# Patient Record
Sex: Male | Born: 1950
Health system: Southern US, Community
[De-identification: ages and names within clinical notes are randomized; demographics above are authoritative.]

## PROBLEM LIST (undated history)

## (undated) DIAGNOSIS — G629 Polyneuropathy, unspecified: Secondary | ICD-10-CM

## (undated) DIAGNOSIS — G4730 Sleep apnea, unspecified: Secondary | ICD-10-CM

## (undated) DIAGNOSIS — E7800 Pure hypercholesterolemia, unspecified: Secondary | ICD-10-CM

## (undated) HISTORY — PX: LEG SURGERY: SHX1003

## (undated) HISTORY — PX: HIP SURGERY: SHX245

---

## 2010-10-10 ENCOUNTER — Other Ambulatory Visit (HOSPITAL_COMMUNITY): Payer: Self-pay | Admitting: Internal Medicine

## 2010-10-10 DIAGNOSIS — M79609 Pain in unspecified limb: Secondary | ICD-10-CM

## 2010-10-13 ENCOUNTER — Ambulatory Visit (HOSPITAL_COMMUNITY)
Admission: RE | Admit: 2010-10-13 | Discharge: 2010-10-13 | Disposition: A | Discharge status: Home / Self Care | Payer: BC Managed Care – PPO | Source: Ambulatory Visit | Attending: Internal Medicine | Admitting: Internal Medicine

## 2010-10-13 ENCOUNTER — Inpatient Hospital Stay (HOSPITAL_COMMUNITY)
Admission: EM | Admit: 2010-10-13 | Discharge: 2010-10-16 | DRG: 277 | Disposition: A | Discharge status: Home / Self Care | Payer: BC Managed Care – PPO | Attending: Internal Medicine | Admitting: Internal Medicine

## 2010-10-13 DIAGNOSIS — D72829 Elevated white blood cell count, unspecified: Secondary | ICD-10-CM | POA: Diagnosis present

## 2010-10-13 DIAGNOSIS — L02419 Cutaneous abscess of limb, unspecified: Principal | ICD-10-CM | POA: Diagnosis present

## 2010-10-13 DIAGNOSIS — I10 Essential (primary) hypertension: Secondary | ICD-10-CM | POA: Diagnosis present

## 2010-10-13 DIAGNOSIS — T380X5A Adverse effect of glucocorticoids and synthetic analogues, initial encounter: Secondary | ICD-10-CM | POA: Diagnosis present

## 2010-10-13 DIAGNOSIS — M79609 Pain in unspecified limb: Secondary | ICD-10-CM | POA: Insufficient documentation

## 2010-10-13 DIAGNOSIS — I8000 Phlebitis and thrombophlebitis of superficial vessels of unspecified lower extremity: Secondary | ICD-10-CM | POA: Diagnosis present

## 2010-10-13 DIAGNOSIS — E119 Type 2 diabetes mellitus without complications: Secondary | ICD-10-CM | POA: Diagnosis present

## 2010-10-13 DIAGNOSIS — L03119 Cellulitis of unspecified part of limb: Principal | ICD-10-CM | POA: Diagnosis present

## 2010-10-13 LAB — CBC
HCT: 42.6 % (ref 39.0–52.0)
Hemoglobin: 14.4 g/dL (ref 13.0–17.0)
MCH: 29.9 pg (ref 26.0–34.0)
MCHC: 33.8 g/dL (ref 30.0–36.0)
MCV: 88.4 fL (ref 78.0–100.0)
Platelets: 176 10*3/uL (ref 150–400)
RBC: 4.82 MIL/uL (ref 4.22–5.81)
RDW: 13 % (ref 11.5–15.5)
WBC: 12.1 10*3/uL — ABNORMAL HIGH (ref 4.0–10.5)

## 2010-10-13 LAB — BASIC METABOLIC PANEL
Calcium: 9.6 mg/dL (ref 8.4–10.5)
GFR calc Af Amer: >60 mL/min (ref >60)
GFR calc non Af Amer: >60 mL/min (ref >60)
Glucose, Bld: 144 mg/dL — ABNORMAL HIGH (ref 70–99)
Potassium: 4.1 mEq/L (ref 3.5–5.1)
Sodium: 138 mEq/L (ref 135–145)

## 2010-10-13 LAB — DIFFERENTIAL
Basophils Absolute: 0 10*3/uL (ref 0.0–0.1)
Basophils Relative: 0 % (ref 0–1)
Eosinophils Absolute: 0.1 10*3/uL (ref 0.0–0.7)
Eosinophils Relative: 1 % (ref 0–5)
Monocytes Absolute: 1.2 10*3/uL — ABNORMAL HIGH (ref 0.1–1.0)
Monocytes Relative: 10 % (ref 3–12)
Neutro Abs: 8.6 10*3/uL — ABNORMAL HIGH (ref 1.7–7.7)

## 2010-10-14 LAB — DIFFERENTIAL
Eosinophils Absolute: 0.1 10*3/uL (ref 0.0–0.7)
Lymphocytes Relative: 21 % (ref 12–46)
Lymphs Abs: 2 10*3/uL (ref 0.7–4.0)
Neutro Abs: 6.4 10*3/uL (ref 1.7–7.7)
Neutrophils Relative %: 66 % (ref 43–77)

## 2010-10-14 LAB — CBC
MCV: 89.2 fL (ref 78.0–100.0)
Platelets: 161 10*3/uL (ref 150–400)
RBC: 4.43 MIL/uL (ref 4.22–5.81)
WBC: 9.7 10*3/uL (ref 4.0–10.5)

## 2010-10-14 LAB — BASIC METABOLIC PANEL
CO2: 29 mEq/L (ref 19–32)
Chloride: 98 mEq/L (ref 96–112)
Creatinine, Ser: 0.96 mg/dL (ref 0.50–1.35)

## 2010-10-14 LAB — GLUCOSE, CAPILLARY
Glucose-Capillary: 201 mg/dL — ABNORMAL HIGH (ref 70–99)
Glucose-Capillary: 139 mg/dL — ABNORMAL HIGH (ref 70–99)
Glucose-Capillary: 118 mg/dL — ABNORMAL HIGH (ref 70–99)

## 2010-10-14 LAB — TSH: TSH: 2.565 u[IU]/mL (ref 0.350–4.500)

## 2010-10-14 LAB — MRSA PCR SCREENING: MRSA by PCR: NEGATIVE

## 2010-10-15 ENCOUNTER — Encounter (HOSPITAL_COMMUNITY): Payer: Self-pay | Admitting: Radiology

## 2010-10-15 ENCOUNTER — Inpatient Hospital Stay (HOSPITAL_COMMUNITY): Payer: BC Managed Care – PPO

## 2010-10-15 LAB — BASIC METABOLIC PANEL
Calcium: 9.4 mg/dL (ref 8.4–10.5)
GFR calc Af Amer: >60 mL/min (ref >60)
GFR calc non Af Amer: >60 mL/min (ref >60)
Sodium: 136 mEq/L (ref 135–145)

## 2010-10-15 LAB — CBC
Platelets: 193 10*3/uL (ref 150–400)
RBC: 4.96 MIL/uL (ref 4.22–5.81)
WBC: 12.1 10*3/uL — ABNORMAL HIGH (ref 4.0–10.5)

## 2010-10-15 LAB — GLUCOSE, CAPILLARY
Glucose-Capillary: 225 mg/dL — ABNORMAL HIGH (ref 70–99)
Glucose-Capillary: 243 mg/dL — ABNORMAL HIGH (ref 70–99)

## 2010-10-16 LAB — GLUCOSE, CAPILLARY: Glucose-Capillary: 193 mg/dL — ABNORMAL HIGH (ref 70–99)

## 2010-10-18 NOTE — Discharge Summary (Signed)
NAMENICHLOS, KUNZLER             ACCOUNT NO.:  [id number]  MEDICAL RECORD NO.:  [id number]  LOCATION:  A308                          FACILITY:  [facility]  PHYSICIAN:  Elliot Cousin, M.D.    DATE OF BIRTH:  03-07-1951  DATE OF ADMISSION:  10/13/2010 DATE OF DISCHARGE:  07/05/2012LH                              DISCHARGE SUMMARY   DISCHARGE DIAGNOSES: 1. Left lower extremity cellulitis with probable superimposed     superficial phlebitis. 2. Morbid obesity. 3. Mild leukocytosis secondary to steroid therapy. 4. Type 2 diabetes mellitus.  The patient's hemoglobin A1c was 7.4. 5. Hypertension, remained controlled. 6. Thyroid function was within normal limits with a TSH of 2.56.  DISCHARGE MEDICATIONS: 1. Flexeril 10 mg 1 tablet 3 times daily. 2. Hydrocodone/APAP 1-2 tablets every 8 h. or 3 times daily as needed     for pain. 3. Keflex 500 mg 3 times daily for 7 more days. 4. Prednisone taper to be tapered over the next 7 days. 5. Glipizide XL 10 mg b.i.d. 6. Lisinopril 5 mg daily. 7. Metformin 1000 mg b.i.d. 8. Simvastatin 10 mg daily.  DISCHARGE DISPOSITION:  The patient was discharged to home in improved and stable condition.  He will follow up with his primary care physician, Dr. Loreta Ave on October 20, 2010, at 11:30 a.m.  CONSULTATIONS:  None.  PROCEDURES PERFORMED:  CT scan of the left leg without contrast on October 15, 2010.  The results revealed infiltration of the subcutaneous fat, greater anteriorly than posteriorly, likely representing dependent edema or cellulitis.  Varices are present in the medial calf.  Dystrophic calcifications are present in the pretibial soft tissues.  Mild muscular atrophy of the gastrocnemius and soleus muscles.  Small vessel atherosclerosis present in the leg.  No evidence of osteomyelitis.  Old avulsion of the medial malleolus.  Ankle osteoarthritis.  HISTORY OF PRESENT ILLNESS:  The patient is a 60 year old man with a past medical history  significant for morbid obesity, type 2 diabetes mellitus, and degenerative joint disease, who presented to the emergency department with a chief complaint of pain in his left leg.  His primary care physician had ordered an outpatient venous ultrasound of the left leg.  The result was negative for deep vein thrombosis.  In the emergency department, the patient was noted to be afebrile and hemodynamically stable.  His WBC was elevated at 12.1.  He was admitted for further evaluation and management.  HOSPITAL COURSE:  The patient was started empirically on Ancef. Vancomycin was added to cover a potential superimposed MRSA infection.  His pain was treated with as needed hydrocodone.  Upon followup examination, the extent of erythema subsided.  However, he became exquisitely tender in his mid left calf area.  It was noted that the patient had quite a few varicosities of both lower extremities.  A CT scan of his left leg was ordered to rule out a deeper infection or other serious abnormality.  The results were dictated above.  Based on the CT scan, and my examination, it was felt that the patient probably had superimposed superficial phlebitis over the area that was exquisitely tender.  Flexeril and a prednisone taper were added to the treatment  regimen.  At the time of hospital discharge, the extent of tenderness subsided, but did not completely resolve.  The erythema did completely resolve.  The patient was discharged on a prednisone taper, Flexeril, and hydrocodone.  Of note, a warm compress was recommended as well.  The patient's WBC did improve to 9.7, but increased to 12.1 prior to discharge, owing to prednisone therapy.  He remained completely afebrile.  He remained hemodynamically stable.  His capillary blood glucose was fairly well-controlled.  His hemoglobin A1c was 7.4.  His TSH was within normal limits at 2.5.     Elliot Cousin, M.D.     DF/MEDQ  D:  10/16/2010  T:   10/17/2010  Job:  [id number]  cc:   Dr. Loreta Ave  Electronically Signed by Elliot Cousin M.D. on 10/18/2010 07:41:05 PM
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
## 2012-11-25 ENCOUNTER — Ambulatory Visit (HOSPITAL_COMMUNITY)
Admission: RE | Admit: 2012-11-25 | Discharge: 2012-11-25 | Disposition: A | Discharge status: Home / Self Care | Payer: Disability Insurance | Source: Ambulatory Visit | Attending: Family Medicine | Admitting: Family Medicine

## 2012-11-25 ENCOUNTER — Other Ambulatory Visit (HOSPITAL_COMMUNITY): Payer: Self-pay | Admitting: Family Medicine

## 2012-11-25 DIAGNOSIS — M25552 Pain in left hip: Secondary | ICD-10-CM

## 2012-11-25 DIAGNOSIS — M25559 Pain in unspecified hip: Secondary | ICD-10-CM | POA: Insufficient documentation

## 2012-11-25 DIAGNOSIS — M25562 Pain in left knee: Secondary | ICD-10-CM

## 2012-11-25 DIAGNOSIS — M25551 Pain in right hip: Secondary | ICD-10-CM

## 2012-11-25 DIAGNOSIS — M25569 Pain in unspecified knee: Secondary | ICD-10-CM | POA: Insufficient documentation

## 2014-04-25 ENCOUNTER — Other Ambulatory Visit (HOSPITAL_COMMUNITY): Payer: Self-pay | Admitting: Physician Assistant

## 2014-04-27 ENCOUNTER — Other Ambulatory Visit (HOSPITAL_COMMUNITY): Payer: Self-pay | Admitting: Physician Assistant

## 2014-04-27 DIAGNOSIS — D179 Benign lipomatous neoplasm, unspecified: Secondary | ICD-10-CM

## 2014-05-01 ENCOUNTER — Other Ambulatory Visit (HOSPITAL_COMMUNITY): Payer: PRIVATE HEALTH INSURANCE

## 2014-07-29 ENCOUNTER — Emergency Department (HOSPITAL_COMMUNITY)
Admission: EM | Admit: 2014-07-29 | Discharge: 2014-07-29 | Disposition: A | Discharge status: Home / Self Care | Payer: Medicare Other | Attending: Emergency Medicine | Admitting: Emergency Medicine

## 2014-07-29 ENCOUNTER — Encounter (HOSPITAL_COMMUNITY): Payer: Self-pay | Admitting: Emergency Medicine

## 2014-07-29 ENCOUNTER — Emergency Department (HOSPITAL_COMMUNITY): Payer: Medicare Other

## 2014-07-29 DIAGNOSIS — Z79899 Other long term (current) drug therapy: Secondary | ICD-10-CM | POA: Diagnosis not present

## 2014-07-29 DIAGNOSIS — Z87891 Personal history of nicotine dependence: Secondary | ICD-10-CM | POA: Diagnosis not present

## 2014-07-29 DIAGNOSIS — R42 Dizziness and giddiness: Secondary | ICD-10-CM | POA: Insufficient documentation

## 2014-07-29 DIAGNOSIS — E119 Type 2 diabetes mellitus without complications: Secondary | ICD-10-CM | POA: Insufficient documentation

## 2014-07-29 DIAGNOSIS — Z791 Long term (current) use of non-steroidal anti-inflammatories (NSAID): Secondary | ICD-10-CM | POA: Diagnosis not present

## 2014-07-29 DIAGNOSIS — Z7982 Long term (current) use of aspirin: Secondary | ICD-10-CM | POA: Diagnosis not present

## 2014-07-29 LAB — COMPREHENSIVE METABOLIC PANEL
ALBUMIN: 3.9 g/dL (ref 3.5–5.2)
ALK PHOS: 81 U/L (ref 39–117)
ALT: 21 U/L (ref 0–53)
ANION GAP: 8 (ref 5–15)
AST: 19 U/L (ref 0–37)
BILIRUBIN TOTAL: 0.7 mg/dL (ref 0.3–1.2)
BUN: 25 mg/dL — AB (ref 6–23)
CALCIUM: 9.3 mg/dL (ref 8.4–10.5)
CO2: 27 mmol/L (ref 19–32)
Chloride: 103 mmol/L (ref 96–112)
Creatinine, Ser: 1.08 mg/dL (ref 0.50–1.35)
GFR calc Af Amer: 81 mL/min — ABNORMAL LOW (ref >90)
GFR calc non Af Amer: 70 mL/min — ABNORMAL LOW (ref >90)
Glucose, Bld: 184 mg/dL — ABNORMAL HIGH (ref 70–99)
Potassium: 4.6 mmol/L (ref 3.5–5.1)
Sodium: 138 mmol/L (ref 135–145)
TOTAL PROTEIN: 7.2 g/dL (ref 6.0–8.3)

## 2014-07-29 LAB — CBC WITH DIFFERENTIAL/PLATELET
BASOS ABS: 0 10*3/uL (ref 0.0–0.1)
Basophils Relative: 0 % (ref 0–1)
Eosinophils Absolute: 0.2 10*3/uL (ref 0.0–0.7)
Eosinophils Relative: 2 % (ref 0–5)
HCT: 42.7 % (ref 39.0–52.0)
HEMOGLOBIN: 14.1 g/dL (ref 13.0–17.0)
LYMPHS PCT: 20 % (ref 12–46)
Lymphs Abs: 2 10*3/uL (ref 0.7–4.0)
MCH: 30.1 pg (ref 26.0–34.0)
MCHC: 33 g/dL (ref 30.0–36.0)
MCV: 91 fL (ref 78.0–100.0)
MONO ABS: 1.1 10*3/uL — AB (ref 0.1–1.0)
Monocytes Relative: 11 % (ref 3–12)
NEUTROS PCT: 67 % (ref 43–77)
Neutro Abs: 6.7 10*3/uL (ref 1.7–7.7)
PLATELETS: 168 10*3/uL (ref 150–400)
RBC: 4.69 MIL/uL (ref 4.22–5.81)
RDW: 13.6 % (ref 11.5–15.5)
WBC: 10 10*3/uL (ref 4.0–10.5)

## 2014-07-29 LAB — CBG MONITORING, ED: Glucose-Capillary: 172 mg/dL — ABNORMAL HIGH (ref 70–99)

## 2014-07-29 MED ORDER — MECLIZINE HCL 12.5 MG PO TABS
25.0000 mg | ORAL_TABLET | Freq: Once | ORAL | Status: AC
  Administered 2014-07-29: 25 mg via ORAL
  Filled 2014-07-29: qty 2

## 2014-07-29 MED ORDER — HYDROGEN PEROXIDE 3 % EX SOLN
CUTANEOUS | Status: AC
  Administered 2014-07-29: 19:00:00
  Filled 2014-07-29: qty 473

## 2014-07-29 MED ORDER — DOCUSATE SODIUM 50 MG/5ML PO LIQD
10.0000 mg | Freq: Once | ORAL | Status: DC
  Filled 2014-07-29: qty 10

## 2014-07-29 MED ORDER — MECLIZINE HCL 25 MG PO TABS: 25.0000 mg | ORAL_TABLET | Freq: Three times a day (TID) | ORAL | Status: DC | PRN

## 2014-07-29 NOTE — ED Provider Notes (Signed)
CSN: [id number]     Arrival date & time 07/29/14  1621 History   First MD Initiated Contact with Patient 07/29/14 1655     Chief Complaint  Patient presents with  . Dizziness     (Consider location/radiation/quality/duration/timing/severity/associated sxs/prior Treatment) Patient is a 64 y.o. male presenting with dizziness. The history is provided by the patient.  Dizziness Quality:  Room spinning Associated symptoms: nausea   Associated symptoms: no chest pain, no diarrhea, no headaches, no shortness of breath, no vomiting and no weakness    patient developed a feeling the room was spinning around 3 hours ago. States he was at work and stood up. No headache. No confusion. States it is improved somewhat but it took a few hours. No numbness or weakness. No confusion. No difficulty using his arms or legs. States it was worse return to his head. States it is improved significantly now. No ringing in his ears. Does have a history of diabetes. No previous history of stroke.  Past Medical History  Diagnosis Date  . Diabetes mellitus    Past Surgical History  Procedure Laterality Date  . Leg surgery Left   . Hip surgery Right    No family history on file. History  Substance Use Topics  . Smoking status: Former Smoker    Types: Cigarettes  . Smokeless tobacco: Not on file  . Alcohol Use: No    Review of Systems  Constitutional: Negative for activity change and appetite change.  Eyes: Negative for pain.  Respiratory: Negative for chest tightness and shortness of breath.   Cardiovascular: Negative for chest pain and leg swelling.  Gastrointestinal: Positive for nausea. Negative for vomiting, abdominal pain and diarrhea.  Genitourinary: Negative for flank pain.  Musculoskeletal: Negative for back pain and neck stiffness.  Skin: Negative for rash.  Neurological: Positive for dizziness. Negative for weakness, numbness and headaches.  Psychiatric/Behavioral: Negative for behavioral  problems.      Allergies  Motrin  Home Medications   Prior to Admission medications   Medication Sig Start Date End Date Taking? Authorizing Provider  aspirin EC 81 MG tablet Take 81 mg by mouth daily.   Yes Historical Provider, MD  glipiZIDE (GLUCOTROL XL) 10 MG 24 hr tablet Take 10 mg by mouth 2 (two) times daily. 07/20/14  Yes Historical Provider, MD  lisinopril (PRINIVIL,ZESTRIL) 5 MG tablet Take 5 mg by mouth daily. 06/20/14  Yes Historical Provider, MD  metFORMIN (GLUCOPHAGE) 1000 MG tablet Take 1,000 mg by mouth 2 (two) times daily. 07/20/14  Yes Historical Provider, MD  naproxen sodium (ANAPROX) 220 MG tablet Take 440 mg by mouth 2 (two) times daily with a meal.   Yes Historical Provider, MD  pioglitazone (ACTOS) 30 MG tablet Take 30 mg by mouth daily.   Yes Historical Provider, MD  simvastatin (ZOCOR) 10 MG tablet Take 10 mg by mouth every evening. 06/20/14  Yes Historical Provider, MD  traMADol-acetaminophen (ULTRACET) 37.5-325 MG per tablet Take 1 tablet by mouth every 4 (four) hours as needed. For pain 07/25/14  Yes Historical Provider, MD  meclizine (ANTIVERT) 25 MG tablet Take 1 tablet (25 mg total) by mouth 3 (three) times daily as needed for dizziness. 07/29/14   Davonna Belling, MD   BP 127/68 mmHg  Pulse 77  Temp(Src) 97.8 F (36.6 C) (Oral)  Resp 13  Ht 5\' 8"  (1.727 m)  Wt 330 lb (149.687 kg)  BMI 50.19 kg/m2  SpO2 97% Physical Exam  Constitutional: He is oriented to person,  place, and time. He appears well-developed and well-nourished.  HENT:  Head: Normocephalic and atraumatic.  Eyes: EOM are normal. Pupils are equal, round, and reactive to light.  Neck: Normal range of motion. Neck supple.  Cardiovascular: Normal rate, regular rhythm and normal heart sounds.   No murmur heard. Pulmonary/Chest: Effort normal and breath sounds normal.  Abdominal: Soft. Bowel sounds are normal. He exhibits no distension and no mass. There is no tenderness. There is no rebound and no  guarding.  Musculoskeletal: Normal range of motion. He exhibits no edema.  Neurological: He is alert and oriented to person, place, and time. No cranial nerve deficit.  No nystagmus. Finger-nose intact bilaterally. Able to stand and ambulate normally.  Skin: Skin is warm and dry.  Psychiatric: He has a normal mood and affect.  Nursing note and vitals reviewed.   ED Course  Procedures (including critical care time) Labs Review Labs Reviewed  COMPREHENSIVE METABOLIC PANEL - Abnormal; Notable for the following:    Glucose, Bld 184 (*)    BUN 25 (*)    GFR calc non Af Amer 70 (*)    GFR calc Af Amer 81 (*)    All other components within normal limits  CBC WITH DIFFERENTIAL/PLATELET - Abnormal; Notable for the following:    Monocytes Absolute 1.1 (*)    All other components within normal limits  CBG MONITORING, ED - Abnormal; Notable for the following:    Glucose-Capillary 172 (*)    All other components within normal limits    Imaging Review Ct Head Wo Contrast  07/29/2014   CLINICAL DATA:  Sudden onset dizziness 2 hr ago.  EXAM: CT HEAD WITHOUT CONTRAST  TECHNIQUE: Contiguous axial images were obtained from the base of the skull through the vertex without intravenous contrast.  COMPARISON:  None.  FINDINGS: Mild ventricular greater than sulcal enlargement likely reflects central predominant cerebral atrophy. There is no evidence of acute cortical infarct, intracranial hemorrhage, mass, midline shift, or extra-axial fluid collection.  Orbits are unremarkable. There is a trace left mastoid effusion. Visualized paranasal sinuses are clear.  IMPRESSION: 1. No evidence of acute intracranial abnormality. 2. Mild cerebral atrophy.   Electronically Signed   By: Logan Bores   On: 07/29/2014 19:32     EKG Interpretation   Date/Time:  Sunday July 29 2014 16:44:06 EDT Ventricular Rate:  83 PR Interval:  209 QRS Duration: 89 QT Interval:  356 QTC Calculation: 418 R Axis:   -34 Text  Interpretation:  Sinus rhythm Left axis deviation Low voltage,  precordial leads Consider anterior infarct Confirmed by Alvino Chapel  MD,  Ovid Curd [phone]) on 07/29/2014 8:50:48 PM      MDM   Final diagnoses:  Vertigo    Patient with vertigo. Did have a couple more episodes while in the ER usually after standing. Lab work reassuring. BUN just minimally elevated. Head CT reassuring. Although the CAT scan cannot rule out central vertigo this is likely peripheral vertigo. No other symptoms along with it. Will discharge to follow-up with his PCP if other neurologic deficits develop he'll return to the ER. Patient feels better after Antivert.    Davonna Belling, MD 07/29/14 2055

## 2014-07-29 NOTE — ED Notes (Signed)
Notified Dr. Alvino Chapel of pt symptoms and handed EKG.

## 2014-07-29 NOTE — Discharge Instructions (Signed)

## 2014-07-29 NOTE — ED Notes (Signed)
Patient verbalizes understanding of discharge instructions, prescription medications, home care and follow up care. Patient out of department at this time with family. 

## 2014-07-29 NOTE — ED Notes (Signed)
Dr. Alvino Chapel at bedside attempting to remove the remainder of earwax.

## 2014-07-29 NOTE — ED Notes (Signed)
Pt reports sudden onset of dizziness about 2 hours ago. Pt states he stood up and got very dizzy. Pt denies any other sx at this time.

## 2014-07-29 NOTE — ED Notes (Signed)
Patient states he got dizzy while in CT, no complaints of dizziness at this time.

## 2014-08-06 ENCOUNTER — Other Ambulatory Visit (HOSPITAL_COMMUNITY): Payer: Self-pay | Admitting: Physician Assistant

## 2014-08-06 DIAGNOSIS — D171 Benign lipomatous neoplasm of skin and subcutaneous tissue of trunk: Secondary | ICD-10-CM

## 2014-08-09 ENCOUNTER — Ambulatory Visit (HOSPITAL_COMMUNITY)
Admission: RE | Admit: 2014-08-09 | Discharge: 2014-08-09 | Disposition: A | Discharge status: Home / Self Care | Payer: Medicare Other | Source: Ambulatory Visit | Attending: Physician Assistant | Admitting: Physician Assistant

## 2014-08-09 DIAGNOSIS — D171 Benign lipomatous neoplasm of skin and subcutaneous tissue of trunk: Secondary | ICD-10-CM

## 2015-03-29 ENCOUNTER — Encounter (HOSPITAL_COMMUNITY): Payer: Self-pay | Admitting: Emergency Medicine

## 2015-03-29 ENCOUNTER — Emergency Department (HOSPITAL_COMMUNITY): Payer: Worker's Compensation

## 2015-03-29 ENCOUNTER — Emergency Department (HOSPITAL_COMMUNITY)
Admission: EM | Admit: 2015-03-29 | Discharge: 2015-03-29 | Disposition: A | Discharge status: Home / Self Care | Payer: Worker's Compensation | Attending: Emergency Medicine | Admitting: Emergency Medicine

## 2015-03-29 DIAGNOSIS — R220 Localized swelling, mass and lump, head: Secondary | ICD-10-CM | POA: Insufficient documentation

## 2015-03-29 DIAGNOSIS — S6991XA Unspecified injury of right wrist, hand and finger(s), initial encounter: Secondary | ICD-10-CM | POA: Diagnosis present

## 2015-03-29 DIAGNOSIS — Y990 Civilian activity done for income or pay: Secondary | ICD-10-CM | POA: Diagnosis not present

## 2015-03-29 DIAGNOSIS — Z7982 Long term (current) use of aspirin: Secondary | ICD-10-CM | POA: Diagnosis not present

## 2015-03-29 DIAGNOSIS — Z7984 Long term (current) use of oral hypoglycemic drugs: Secondary | ICD-10-CM | POA: Insufficient documentation

## 2015-03-29 DIAGNOSIS — S0990XA Unspecified injury of head, initial encounter: Secondary | ICD-10-CM | POA: Diagnosis not present

## 2015-03-29 DIAGNOSIS — Z79899 Other long term (current) drug therapy: Secondary | ICD-10-CM | POA: Insufficient documentation

## 2015-03-29 DIAGNOSIS — Z791 Long term (current) use of non-steroidal anti-inflammatories (NSAID): Secondary | ICD-10-CM | POA: Diagnosis not present

## 2015-03-29 DIAGNOSIS — E119 Type 2 diabetes mellitus without complications: Secondary | ICD-10-CM | POA: Diagnosis not present

## 2015-03-29 DIAGNOSIS — Y9289 Other specified places as the place of occurrence of the external cause: Secondary | ICD-10-CM | POA: Diagnosis not present

## 2015-03-29 DIAGNOSIS — W19XXXA Unspecified fall, initial encounter: Secondary | ICD-10-CM

## 2015-03-29 DIAGNOSIS — W01198A Fall on same level from slipping, tripping and stumbling with subsequent striking against other object, initial encounter: Secondary | ICD-10-CM | POA: Diagnosis not present

## 2015-03-29 DIAGNOSIS — S0031XA Abrasion of nose, initial encounter: Secondary | ICD-10-CM | POA: Diagnosis not present

## 2015-03-29 DIAGNOSIS — S00531A Contusion of lip, initial encounter: Secondary | ICD-10-CM | POA: Insufficient documentation

## 2015-03-29 DIAGNOSIS — T07XXXA Unspecified multiple injuries, initial encounter: Secondary | ICD-10-CM

## 2015-03-29 DIAGNOSIS — Z23 Encounter for immunization: Secondary | ICD-10-CM | POA: Insufficient documentation

## 2015-03-29 DIAGNOSIS — S62306A Unspecified fracture of fifth metacarpal bone, right hand, initial encounter for closed fracture: Secondary | ICD-10-CM | POA: Insufficient documentation

## 2015-03-29 DIAGNOSIS — Y9389 Activity, other specified: Secondary | ICD-10-CM | POA: Diagnosis not present

## 2015-03-29 DIAGNOSIS — Z87891 Personal history of nicotine dependence: Secondary | ICD-10-CM | POA: Diagnosis not present

## 2015-03-29 DIAGNOSIS — S00511A Abrasion of lip, initial encounter: Secondary | ICD-10-CM | POA: Diagnosis not present

## 2015-03-29 MED ORDER — TRAMADOL HCL 50 MG PO TABS: 50.0000 mg | ORAL_TABLET | Freq: Four times a day (QID) | ORAL | Status: DC | PRN

## 2015-03-29 MED ORDER — TETANUS-DIPHTH-ACELL PERTUSSIS 5-2.5-18.5 LF-MCG/0.5 IM SUSP
0.5000 mL | Freq: Once | INTRAMUSCULAR | Status: AC
  Administered 2015-03-29: 0.5 mL via INTRAMUSCULAR
  Filled 2015-03-29: qty 0.5

## 2015-03-29 NOTE — ED Notes (Signed)
Pt states understanding of care given and follow up instructions 

## 2015-03-29 NOTE — ED Provider Notes (Signed)
CSN: [id number]     Arrival date & time 03/29/15  1631 History   First MD Initiated Contact with Patient 03/29/15 1651     Chief Complaint  Patient presents with  . Fall  . Facial Injury  . Hand Injury      HPI  Pt was seen at 1655. Per pt, c/o sudden onset and resolution of one episode of slip and fall that occurred approximately 1515 PTA. Pt states he fell forward onto his right hand and face. Pt c/o head injury, face pain, right wrist/hand pain. Pt has been ambulatory since the fall. Denies LOC, no AMS, no CP/SOB, no abd pain, no N/V/D, no focal motor weakness, no tingling/numbness in extremities.    Unk last Td Past Medical History  Diagnosis Date  . Diabetes mellitus    Past Surgical History  Procedure Laterality Date  . Leg surgery Left   . Hip surgery Right     Social History  Substance Use Topics  . Smoking status: Former Smoker    Types: Cigarettes  . Smokeless tobacco: None  . Alcohol Use: No    Review of Systems ROS: Statement: All systems negative except as marked or noted in the HPI; Constitutional: Negative for fever and chills. ; ; Eyes: Negative for eye pain, redness and discharge. ; ; ENMT: Negative for ear pain, hoarseness, nasal congestion, sinus pressure and sore throat. ; ; Cardiovascular: Negative for chest pain, palpitations, diaphoresis, dyspnea and peripheral edema. ; ; Respiratory: Negative for cough, wheezing and stridor. ; ; Gastrointestinal: Negative for nausea, vomiting, diarrhea, abdominal pain, blood in stool, hematemesis, jaundice and rectal bleeding. . ; ; Genitourinary: Negative for dysuria, flank pain and hematuria. ; ; Musculoskeletal: +head injury, face injury, right hand/wrist injury. Negative for back pain and neck pain. Negative for swelling and deformity.; ; Skin: +abrasions, ecchymosis. Negative for pruritus, rash, blisters, and skin lesion.; ; Neuro: Negative for headache, lightheadedness and neck stiffness. Negative for weakness,  altered level of consciousness , altered mental status, extremity weakness, paresthesias, involuntary movement, seizure and syncope.      Allergies  Motrin  Home Medications   Prior to Admission medications   Medication Sig Start Date End Date Taking? Authorizing Provider  aspirin EC 81 MG tablet Take 81 mg by mouth daily.    Historical Provider, MD  glipiZIDE (GLUCOTROL XL) 10 MG 24 hr tablet Take 10 mg by mouth 2 (two) times daily. 07/20/14   Historical Provider, MD  lisinopril (PRINIVIL,ZESTRIL) 5 MG tablet Take 5 mg by mouth daily. 06/20/14   Historical Provider, MD  meclizine (ANTIVERT) 25 MG tablet Take 1 tablet (25 mg total) by mouth 3 (three) times daily as needed for dizziness. 07/29/14   Davonna Belling, MD  metFORMIN (GLUCOPHAGE) 1000 MG tablet Take 1,000 mg by mouth 2 (two) times daily. 07/20/14   Historical Provider, MD  naproxen sodium (ANAPROX) 220 MG tablet Take 440 mg by mouth 2 (two) times daily with a meal.    Historical Provider, MD  pioglitazone (ACTOS) 30 MG tablet Take 30 mg by mouth daily.    Historical Provider, MD  simvastatin (ZOCOR) 10 MG tablet Take 10 mg by mouth every evening. 06/20/14   Historical Provider, MD  traMADol-acetaminophen (ULTRACET) 37.5-325 MG per tablet Take 1 tablet by mouth every 4 (four) hours as needed. For pain 07/25/14   Historical Provider, MD   BP 114/51 mmHg  Pulse 68  Temp(Src) 97.5 F (36.4 C) (Oral)  Resp 16  Ht 5'  8" (1.727 m)  Wt 350 lb (158.759 kg)  BMI 53.23 kg/m2  SpO2 98% Physical Exam  1700: Physical examination: Vital signs and O2 SAT: Reviewed; Constitutional: Well developed, Well nourished, Well hydrated, In no acute distress; Head and Face: Normocephalic, +small superficial abrasion to mid-forehead. No scalp hematomas, no lacs.  Non-tender to palp superior and inferior orbital rim areas.  No zygoma tenderness.  No mandibular tenderness.; Eyes: EOMI, PERRL, No scleral icterus; ENMT: Mouth and pharynx normal, Left TM normal,  Right TM normal, Mucous membranes moist, +teeth and tongue intact.  No intraoral or intranasal bleeding.  No septal hematomas.  No trismus, no malocclusion. +small superficial abrasions to bridge of nose, right lateral nares, right lateral upper lip. +right upper lateral lip with localized edema and faint ecchymosis. +superficial lac inside right lateral upper lip, hemostatic, not gaping, no obvious FB..; Neck: Supple, Full range of motion, No lymphadenopathy; Spine:  No midline CS, TS, LS tenderness.; Cardiovascular: Regular rate and rhythm, No gallop; Respiratory: Breath sounds clear & equal bilaterally, No wheezes, Normal respiratory effort/excursion; Chest: Nontender, No deformity, Movement normal, No crepitus, No abrasions or ecchymosis.; Abdomen: Soft, Nontender, Nondistended, Normal bowel sounds, No abrasions or ecchymosis.; Genitourinary: No CVA tenderness.; Extremities: No deformity, NT right shoulder/elbow. +mild TTP right dorsal medial wrist area without edema, deformity, abrasion or ecchymosis. No snuffbox tenderness.  No pain to axial thumb or 3rd MCP loading.  Forearm compartments soft, strong radial pp, brisk cap refill in fingers. +TTP right 5th metacarpal area with localized abrasion to right dorsal-medial 5th finger, +mild edema. No ecchymosis, no deformity. Decreased ROM right 5th finger, otherwise NMS intact right hand and Full range of motion major/large joints of bilat UE's and LE's without pain or tenderness to palp, Neurovascularly intact, Pulses normal, Pelvis stable; Neuro: AA&Ox3, GCS 15.  Major CN grossly intact. Speech clear. No gross focal motor or sensory deficits in extremities.; Skin: Color normal, Warm, Dry   ED Course  Procedures (including critical care time) Labs Review   Imaging Review  I have personally reviewed and evaluated these images and lab results as part of my medical decision-making.   EKG Interpretation None      MDM  MDM Reviewed: previous chart,  nursing note and vitals Interpretation: x-ray and CT scan    Dg Wrist Complete Right 03/29/2015  CLINICAL DATA:  [place] 4-5 feet from dock at work landing on head and face, RIGHT fifth metacarpal area pain EXAM: RIGHT WRIST - COMPLETE 3+ VIEW COMPARISON:  None FINDINGS: Osseous mineralization normal. Joint spaces preserved. Fracture at base of RIGHT fifth metacarpal suspect extending intra-articular at fifth [facility] joint. Degenerative changes at STT joint. No additional fracture, dislocation, or bone destruction. Scattered small vessel vascular calcification. IMPRESSION: Metaphyseal fracture at base of RIGHT fifth metacarpal with suspicion of extension into fifth CMC joint. Electronically Signed   By: Lavonia Dana M.D.   On: 03/29/2015 18:04   Ct Head Wo Contrast 03/29/2015  CLINICAL DATA:  Headache with anterior and bilateral facial pain. Trip and fall, landing face first on concrete today. EXAM: CT HEAD WITHOUT CONTRAST CT MAXILLOFACIAL WITHOUT CONTRAST CT CERVICAL SPINE WITHOUT CONTRAST TECHNIQUE: Multidetector CT imaging of the head, cervical spine, and maxillofacial structures were performed using the standard protocol without intravenous contrast. Multiplanar CT image reconstructions of the cervical spine and maxillofacial structures were also generated. COMPARISON:  Head CT 07/29/2014 FINDINGS: CT HEAD FINDINGS No intracranial hemorrhage, mass effect, or midline shift. Mid cerebral atrophy, unchanged. No hydrocephalus. The  basilar cisterns are patent. No evidence of territorial infarct. No intracranial fluid collection. Calvarium is intact. No calvarial fracture. Minimal opacification of lower mastoid air cells, unchanged from prior exam. CT MAXILLOFACIAL FINDINGS No facial bone fracture. The orbits and globes are intact. The nasal bone, mandibles, zygomatic arches and pterygoid plates are intact. Paranasal sinuses are well-aerated without fluid level. No radiopaque foreign body or localizing soft tissue  abnormality. CT CERVICAL SPINE FINDINGS Cervical spine alignment is maintained. Vertebral body heights are preserved. There is no fracture. The dens is intact. There are no jumped or perched facets. Mild multilevel disc space narrowing and endplate spurring. Multilevel facet arthropathy. No prevertebral soft tissue edema. IMPRESSION: 1. No acute intracranial abnormality. 2. No facial bone fracture. 3. Degenerative change in the cervical spine without acute fracture or subluxation. Electronically Signed   By: Jeb Levering M.D.   On: 03/29/2015 18:37   Ct Cervical Spine Wo Contrast 03/29/2015  CLINICAL DATA:  Headache with anterior and bilateral facial pain. Trip and fall, landing face first on concrete today. EXAM: CT HEAD WITHOUT CONTRAST CT MAXILLOFACIAL WITHOUT CONTRAST CT CERVICAL SPINE WITHOUT CONTRAST TECHNIQUE: Multidetector CT imaging of the head, cervical spine, and maxillofacial structures were performed using the standard protocol without intravenous contrast. Multiplanar CT image reconstructions of the cervical spine and maxillofacial structures were also generated. COMPARISON:  Head CT 07/29/2014 FINDINGS: CT HEAD FINDINGS No intracranial hemorrhage, mass effect, or midline shift. Mid cerebral atrophy, unchanged. No hydrocephalus. The basilar cisterns are patent. No evidence of territorial infarct. No intracranial fluid collection. Calvarium is intact. No calvarial fracture. Minimal opacification of lower mastoid air cells, unchanged from prior exam. CT MAXILLOFACIAL FINDINGS No facial bone fracture. The orbits and globes are intact. The nasal bone, mandibles, zygomatic arches and pterygoid plates are intact. Paranasal sinuses are well-aerated without fluid level. No radiopaque foreign body or localizing soft tissue abnormality. CT CERVICAL SPINE FINDINGS Cervical spine alignment is maintained. Vertebral body heights are preserved. There is no fracture. The dens is intact. There are no jumped or  perched facets. Mild multilevel disc space narrowing and endplate spurring. Multilevel facet arthropathy. No prevertebral soft tissue edema. IMPRESSION: 1. No acute intracranial abnormality. 2. No facial bone fracture. 3. Degenerative change in the cervical spine without acute fracture or subluxation. Electronically Signed   By: Jeb Levering M.D.   On: 03/29/2015 18:37   Dg Hand Complete Right 03/29/2015  CLINICAL DATA:  Fall from dock 4-5 feet with right hand pain, initial encounter EXAM: RIGHT HAND - COMPLETE 3+ VIEW COMPARISON:  None. FINDINGS: There is a fracture through the base of the fifth metacarpal with only mild displacement identified. No other fractures are seen. Soft tissue swelling is noted in this region. IMPRESSION: Fracture at the base of the fifth metacarpal. Electronically Signed   By: Inez Catalina M.D.   On: 03/29/2015 18:05   Ct Maxillofacial Wo Cm 03/29/2015  CLINICAL DATA:  Headache with anterior and bilateral facial pain. Trip and fall, landing face first on concrete today. EXAM: CT HEAD WITHOUT CONTRAST CT MAXILLOFACIAL WITHOUT CONTRAST CT CERVICAL SPINE WITHOUT CONTRAST TECHNIQUE: Multidetector CT imaging of the head, cervical spine, and maxillofacial structures were performed using the standard protocol without intravenous contrast. Multiplanar CT image reconstructions of the cervical spine and maxillofacial structures were also generated. COMPARISON:  Head CT 07/29/2014 FINDINGS: CT HEAD FINDINGS No intracranial hemorrhage, mass effect, or midline shift. Mid cerebral atrophy, unchanged. No hydrocephalus. The basilar cisterns are patent. No evidence of territorial  infarct. No intracranial fluid collection. Calvarium is intact. No calvarial fracture. Minimal opacification of lower mastoid air cells, unchanged from prior exam. CT MAXILLOFACIAL FINDINGS No facial bone fracture. The orbits and globes are intact. The nasal bone, mandibles, zygomatic arches and pterygoid plates are  intact. Paranasal sinuses are well-aerated without fluid level. No radiopaque foreign body or localizing soft tissue abnormality. CT CERVICAL SPINE FINDINGS Cervical spine alignment is maintained. Vertebral body heights are preserved. There is no fracture. The dens is intact. There are no jumped or perched facets. Mild multilevel disc space narrowing and endplate spurring. Multilevel facet arthropathy. No prevertebral soft tissue edema. IMPRESSION: 1. No acute intracranial abnormality. 2. No facial bone fracture. 3. Degenerative change in the cervical spine without acute fracture or subluxation. Electronically Signed   By: Jeb Levering M.D.   On: 03/29/2015 18:37    1900:  Td updated. Wound care provided. Splint/sling, f/u Ortho MD. Tx abrasions symptomatically. Dx and testing d/w pt and family.  Questions answered.  Verb understanding, agreeable to d/c home with outpt f/u.    Francine Graven, DO 03/31/15 2359

## 2015-03-29 NOTE — Discharge Instructions (Signed)
°Emergency Department Resource Guide °1) Find a Doctor and Pay Out of Pocket °Although you won't have to find out who is covered by your insurance plan, it is a good idea to ask around and get recommendations. You will then need to call the office and see if the doctor you have chosen will accept you as a new patient and what types of options they offer for patients who are self-pay. Some doctors offer discounts or will set up payment plans for their patients who do not have insurance, but you will need to ask so you aren't surprised when you get to your appointment. ° °2) Contact Your Local Health Department °Not all health departments have doctors that can see patients for sick visits, but many do, so it is worth a call to see if yours does. If you don't know where your local health department is, you can check in your phone book. The CDC also has a tool to help you locate your state's health department, and many state websites also have listings of all of their local health departments. ° °3) Find a Walk-in Clinic °If your illness is not likely to be very severe or complicated, you may want to try a walk in clinic. These are popping up all over the country in pharmacies, drugstores, and shopping centers. They're usually staffed by nurse practitioners or physician assistants that have been trained to treat common illnesses and complaints. They're usually fairly quick and inexpensive. However, if you have serious medical issues or chronic medical problems, these are probably not your best option. ° °No Primary Care Doctor: °- Call [facility] at  832-8000 - they can help you locate a primary care doctor that  accepts your insurance, provides certain services, etc. °- Physician Referral Service- 1-800-533-3463 ° °Chronic Pain Problems: °Organization         Address  Phone   Notes  °[facility]  (336) 297-2271 Patients need to be referred by their primary care doctor.  ° °Medication  Assistance: °Organization         Address  Phone   Notes  °[place] [facility] [address] °[place], [place] [address] (336) 641-8030 --Must be a resident of [place] °-- Must have NO insurance coverage whatsoever (no Medicaid/ Medicare, etc.) °-- The pt. MUST have a primary care doctor that directs their care regularly and follows them in the community °  °[facility]  (866) 331-1348   °[employer]  (888) 892-1162   ° °Agencies that provide inexpensive medical care: °Organization         Address  Phone   Notes  °[facility]  (336) 832-8035   °[facility]    (336) 832-7272   °[facility] [address] °[place], [place] [address] (336) 832-4777   °[facility] [address], °[place] (336) 271-4999   °[facility]    (336) 373-0678   °[facility]    (336) 272-1050   °[facility] ° [address], [place] Phone:  (336) 832-4444, Fax:  (336) 832-4440 Hours of Operation:  9 am - 6 pm, M-F.  Also accepts Medicaid/Medicare and self-pay.  °[facility] ° [address], [place] Phone: (336) [phone], Fax: (336) [phone]. Hours of Operation:  8:30 am - 5:30 pm, M-F.  Also accepts Medicaid and self-pay.  °[facility] [place] [address], [place] Phone: (336) 878-6027   °[facility] [address], [place], [place] (336)723-1848, Ext. 123 Mondays & Thursdays: 7-9 AM.  First 15 patients are seen on a first come, first serve basis. °  ° °Medicaid-accepting [place] Providers: ° °Organization         Address  Phone   Notes  °[facility] [address], [place] (336) 641-2100 Also accepts self-pay patients.  °[facility] [address], [place] ° (336) 856-9996   °[facility] [address], [place]  (336) 288-8857   °[facility] [address] [place] [address], [place] (336) 299-7000   °Veita Bland [address], [place]  ° (336) 373-1557 Only accepts [place] Access Medicaid patients after they have their name applied to their card.  ° °Self-Pay (no insurance) in [place]: ° °Organization         Address  Phone   Notes  °Sickle Cell Patients, [facility] [address], [place] (336) 832-1970   °[facility] [address], [place] (336) 832-4400   °[facility] ° [address], [place] (336) 992-4800   °[facility]/Dr. Osei-Bonsu ° [address] [place] [address], [place] or [address], [place] (336) 841-8500 Phone number for both [place] and [place] locations is the same.  °[facility] [address], [place] (336) 299-0000   °[facility] [address] [place] [address], [place] or [address] (336) 852-7530 °(336) 878-2260   °[facility] [address], [place] (336) 350-1642, phone; (336) 294-5005, fax Sees patients 1st and 3rd Saturday of every month.  Must not qualify for public or private insurance (i.e. Medicaid, Medicare, [place] Health Choice, Veterans' Benefits) • Household income should be no more than 200% of the poverty level •The clinic cannot treat you if you are pregnant or think you are pregnant • Sexually transmitted diseases are not treated at the clinic.  ° ° °Dental Care: °Organization         Address  Phone  Notes  °[place] [facility] [address], [place] (336) 641-6152 Accepts children up to age 21 who are enrolled in Medicaid or [place] Health Choice; pregnant women with a Medicaid card; and children who have applied for Medicaid or [place] Health Choice, but were declined, whose parents can pay a reduced fee at time of service.  °[place]  [facility] [place]  [address], [place] (336) 641-7733 Accepts children up to age 21 who are enrolled in Medicaid or [place] Health Choice; pregnant women with a Medicaid card; and children who have applied for Medicaid or [place] Health Choice, but were declined, whose parents can pay a reduced fee at time of service.  °[facility] ° [address], [place] (336) 641-4533 Patients are seen by appointment only. Walk-ins are not accepted. [facility] will see patients 18 years of age and older. °Monday - Tuesday (8am-5pm) °Most Wednesdays (8:30-5pm) °$30 per visit, cash only  °[facility] ° [address], [place] (336) 641-4533 Patients are seen by appointment only. Walk-ins are not accepted. [facility] will see patients 18 years of age and older. °One   Wednesday Evening (Monthly: Volunteer Based).  $30 per visit, cash only  °[facility]  (919) 537-3737 for adults; Children under age 4, call Graduate Pediatric Dentistry at (919) [phone]. Children aged 4-14, please call (919) [phone] to request a pediatric application. ° Dental services are provided in all areas of dental care including fillings, crowns and bridges, complete and partial dentures, implants, gum treatment, root canals, and extractions. Preventive care is also provided. Treatment is provided to both adults and children. °Patients are selected via a lottery and there is often a waiting list. °  °[facility] [address], °[place] ° (336) 763-8833 [email or web address].[email or web address] °  °[facility] [address], [place], [place] (336)723-1848, Ext. 123 Second and Fourth Thursday of each month, opens at 6:30 AM; Clinic ends at 9 AM.  Patients are seen on a first-come first-served basis, and a limited number are seen during each clinic.  ° °[facility] ° [address], [place], [place] (336) 723-7904    Eligibility Requirements °You must have lived in [place], [place], or [place] counties for at least the last three months. °  You cannot be eligible for state or federal sponsored healthcare insurance, including Veterans Administration, Medicaid, or Medicare. °  You generally cannot be eligible for healthcare insurance through your employer.  °  How to apply: °Eligibility screenings are held every Tuesday and Wednesday afternoon from 1:00 pm until 4:00 pm. You do not need an appointment for the interview!  °[facility] [address], [place], [place] 336-631-2330   °[facility]  336-342-8273   °[facility]  336-703-3100   °[facility]  336-570-6415   ° °Behavioral Health Resources in the Community: °Intensive Outpatient Programs °Organization         Address  Phone  Notes  °[facility] [address], [place], [place] 336-878-6098   °[facility] [address], [place], [place] 336-832-9800   °[facility] [address], [place], [place] ° 336-882-2125   °[facility] [address],  °[place], [place] 1-800-853-5163 or 336-641-4981   °Substance Abuse Resources °Organization         Address  Phone  Notes  °[facility]  336-882-2125   °[facility]  336-784-9470   °[facility]  336-285-9073   °[facility]  336-845-3988   °Residential & Outpatient Substance Abuse Program  1-800-659-3381   °Psychological Services °Organization         Address  Phone  Notes  ° Health  336- 832-9600   °[facility]  336- 378-7881   °[facility] [address], [place] 1-800-853-5163 or 336-641-4981   ° °Mobile Crisis Teams °Organization         Address  Phone  Notes  °[facility], Mobile Crisis Care Unit  1-877-626-1772   °Assertive °[facility] ° [address]  [place], [place] 336-834-9664   °Sharon DeEsch [address] °[place] 336-554-5454   ° °Self-Help/Support Groups °Organization         Address  Phone             Notes  °[facility] [place] - variety of support groups  336- 373-1402 Call for more information  °Narcotics Anonymous (NA), [facility] [address], °[place]  2 meetings at this location  ° °  Residential Treatment Programs Organization         Address  Phone  Notes  [facility] [address],    [place]  1-413-872-5704   [facility]  [address], [place] [id number], [place], [place]   [place], [place] [phone] Admissions: 8am-3pm M-F  [facility] [place] [address]    [place], [place] [id number]   [facility] [address] [place], [place], [place]   [facility] [address]  [place], [place]   [facility] - Intensive Outpatient [address], [place], [place]   [facility] ([place]  [place], [place] 1-830-816-2708 or 3307750705   [facility] ([facility] [address], [place], [place] Accepts Medicaid  [facility] [address],  [place] [phone] Substance Abuse/Addiction Treatment   [facility]         Address  Phone  Notes  [facility]  (929)012-3192   Domenic Schwab, PhD [address] Arlis Porta [place], [place]   308 163 0090 or 480-691-1898   [facility] [place], [place] 680-772-7562   [facility] [address] [place], [place] 518-005-6757 Insurance/Medicaid/sponsorship through [facility] Families [address]                                    [place], [place] [phone] [facility] [address], [place] [phone]    Dr. Adele Schilder  [phone]   [facility] Dept. 1) [address] [place] 2) [place] 3)  [place] [address], [place] ([phone]) [phone]  [phone]   [place] (640) [phone] or [phone] (After Hours)      Take the prescription as directed.  Wash the abraded areas with soap and water at least twice a day, and cover with a clean/dry dressing.  Change the dressing whenever it becomes wet or soiled after washing the area with soap and water.  Apply moist heat or ice to the area(s) of discomfort, for 15 minutes at a time, several times per day for the next few days.  Do not fall asleep on a heating or ice pack. Call the Orthopedic doctor tomorrow to schedule a follow up appointment within the next 3 days. Call your regular medical doctor tomorrow to schedule a follow up appointment within the next week. Return to the Emergency Department immediately if worsening.

## 2015-03-29 NOTE — ED Notes (Signed)
Patient states he was at work and tripped over something, falling face first onto the concrete today. Patient has abrasion noted to upper lip, forehead, and right pinky finger. Bandage noted to nose. Denies LOC. Complaining of pain to head, face, and right hand.

## 2016-01-09 ENCOUNTER — Telehealth: Payer: Self-pay | Admitting: General Practice

## 2016-01-09 NOTE — Telephone Encounter (Signed)
Opened in error

## 2016-01-16 ENCOUNTER — Telehealth: Payer: Self-pay

## 2016-01-16 NOTE — Telephone Encounter (Signed)
Pt is calling about get his TCS set up. He has an appointment on 01/27/16 but he is not having the abd pain anymore like he was. He found out that someone changed his metformin pill for 500 mg to a 1,000 mg and he was taking to much. Since he has stopped the medication his pain has gone. Please advise if he will still need an office visit. His call back number is  [phone]

## 2016-01-16 NOTE — Telephone Encounter (Signed)
Ok to schedule. 1/2 his glipizide and actos day of prep.

## 2016-01-16 NOTE — Telephone Encounter (Signed)
Gastroenterology Pre-Procedure Review  Request Date: 01/16/2016 Requesting Physician: Dr. Gerarda Fraction  PATIENT REVIEW QUESTIONS: The patient responded to the following health history questions as indicated:    PT had scheduled an OV first due to abdominal pain. Said he found out what was causing the pain, his Metformin had been increased to 1000 mg and he was taking it bid and had been on 500 mg bid. He said it caused very bad abdominal pain and he couldn't sleep and constipation. He stopped taking it altogether and only takes his Glipizide and Actos and BS running around 110-120.   1. Diabetes Melitis: YES 2. Joint replacements in the past 12 months: no 3. Major health problems in the past 3 months: no 4. Has an artificial valve or MVP: no 5. Has a defibrillator: no 6. Has been advised in past to take antibiotics in advance of a procedure like teeth cleaning: no 7. Family history of colon cancer: no  8. Alcohol Use: no 9. History of sleep apnea: HAS SLEEP APNEA AND HAS A C PAP    MEDICATIONS & ALLERGIES:    Patient reports the following regarding taking any blood thinners:   Plavix? no Aspirin? Yes Coumadin? no  Patient confirms/reports the following medications:  Current Outpatient Prescriptions  Medication Sig Dispense Refill  . aspirin EC 81 MG tablet Take 81 mg by mouth daily.    [place] glipiZIDE (GLUCOTROL XL) 10 MG 24 hr tablet Take 10 mg by mouth 2 (two) times daily.    [place] lisinopril (PRINIVIL,ZESTRIL) 5 MG tablet Take 5 mg by mouth daily.    . naproxen sodium (ANAPROX) 220 MG tablet Take 440 mg by mouth 2 (two) times daily as needed (for pain).     . pioglitazone (ACTOS) 30 MG tablet Take 30 mg by mouth daily.    . simvastatin (ZOCOR) 10 MG tablet Take 5 mg by mouth every evening.     . metFORMIN (GLUCOPHAGE) 1000 MG tablet Take 1,000 mg by mouth 2 (two) times daily.     No current facility-administered medications for this visit.     Patient confirms/reports the following  allergies:  Allergies  Allergen Reactions  . Motrin [Ibuprofen] Other (See Comments)    Possible reaction resulting in "feeling like bugs crawling all over me"    No orders of the defined types were placed in this encounter.   AUTHORIZATION INFORMATION Primary Insurance:   ID #:   Group #:  Pre-Cert / Auth required:  Pre-Cert / Auth #:   Secondary Insurance:   ID #:   Group #:  Pre-Cert / Auth required: Pre-Cert / Auth #:   SCHEDULE INFORMATION: Procedure has been scheduled as follows:  Date:                   Time:   Location:   This Gastroenterology Pre-Precedure Review Form is being routed to the following provider(s): R. Garfield Cornea, MD

## 2016-01-20 ENCOUNTER — Other Ambulatory Visit: Payer: Self-pay

## 2016-01-20 DIAGNOSIS — Z1211 Encounter for screening for malignant neoplasm of colon: Secondary | ICD-10-CM

## 2016-01-20 MED ORDER — PEG 3350-KCL-NA BICARB-NACL 420 G PO SOLR: 4000.0000 mL | ORAL | 0 refills | Status: DC

## 2016-01-20 NOTE — Telephone Encounter (Signed)
PT has been scheduled for 02/26/2016 at 8:30 Am with Dr. Gala Romney. The appt on 01/27/2016 has been cancelled.

## 2016-01-20 NOTE — Telephone Encounter (Signed)
Rx has been sent to the pharmacy and instructions mailed to pt.

## 2016-01-27 ENCOUNTER — Ambulatory Visit: Payer: Self-pay | Admitting: Gastroenterology

## 2016-02-11 ENCOUNTER — Telehealth: Payer: Self-pay

## 2016-02-11 NOTE — Telephone Encounter (Signed)
Spoke to [place] and [place] not required for the screening colonoscopy.

## 2016-02-24 ENCOUNTER — Telehealth: Payer: Self-pay

## 2016-02-24 NOTE — Telephone Encounter (Signed)
I called pt and he has not had any change in his meds since he was triaged for the colonoscopy.

## 2016-02-25 NOTE — Telephone Encounter (Signed)
OK. Same instructions for DM meds as before.

## 2016-02-26 ENCOUNTER — Ambulatory Visit (HOSPITAL_COMMUNITY)
Admission: RE | Admit: 2016-02-26 | Discharge: 2016-02-26 | Disposition: A | Discharge status: Home / Self Care | Payer: Medicare Other | Source: Ambulatory Visit | Attending: Internal Medicine | Admitting: Internal Medicine

## 2016-02-26 ENCOUNTER — Encounter (HOSPITAL_COMMUNITY): Payer: Self-pay | Admitting: *Deleted

## 2016-02-26 ENCOUNTER — Encounter (HOSPITAL_COMMUNITY)
Admission: RE | Disposition: A | Discharge status: Home / Self Care | Payer: Self-pay | Source: Ambulatory Visit | Attending: Internal Medicine

## 2016-02-26 DIAGNOSIS — Z7982 Long term (current) use of aspirin: Secondary | ICD-10-CM | POA: Insufficient documentation

## 2016-02-26 DIAGNOSIS — Z79899 Other long term (current) drug therapy: Secondary | ICD-10-CM | POA: Diagnosis not present

## 2016-02-26 DIAGNOSIS — E7800 Pure hypercholesterolemia, unspecified: Secondary | ICD-10-CM | POA: Diagnosis not present

## 2016-02-26 DIAGNOSIS — Z1211 Encounter for screening for malignant neoplasm of colon: Secondary | ICD-10-CM | POA: Diagnosis not present

## 2016-02-26 DIAGNOSIS — K639 Disease of intestine, unspecified: Secondary | ICD-10-CM | POA: Insufficient documentation

## 2016-02-26 DIAGNOSIS — Z1212 Encounter for screening for malignant neoplasm of rectum: Secondary | ICD-10-CM

## 2016-02-26 DIAGNOSIS — Z888 Allergy status to other drugs, medicaments and biological substances status: Secondary | ICD-10-CM | POA: Diagnosis not present

## 2016-02-26 DIAGNOSIS — Z87891 Personal history of nicotine dependence: Secondary | ICD-10-CM | POA: Diagnosis not present

## 2016-02-26 DIAGNOSIS — E119 Type 2 diabetes mellitus without complications: Secondary | ICD-10-CM | POA: Diagnosis not present

## 2016-02-26 DIAGNOSIS — Z7984 Long term (current) use of oral hypoglycemic drugs: Secondary | ICD-10-CM | POA: Diagnosis not present

## 2016-02-26 DIAGNOSIS — G4730 Sleep apnea, unspecified: Secondary | ICD-10-CM | POA: Insufficient documentation

## 2016-02-26 DIAGNOSIS — G629 Polyneuropathy, unspecified: Secondary | ICD-10-CM | POA: Insufficient documentation

## 2016-02-26 DIAGNOSIS — D123 Benign neoplasm of transverse colon: Secondary | ICD-10-CM | POA: Diagnosis not present

## 2016-02-26 HISTORY — PX: COLONOSCOPY: SHX5424

## 2016-02-26 HISTORY — DX: Pure hypercholesterolemia, unspecified: E78.00

## 2016-02-26 HISTORY — DX: Polyneuropathy, unspecified: G62.9

## 2016-02-26 HISTORY — DX: Sleep apnea, unspecified: G47.30

## 2016-02-26 LAB — GLUCOSE, CAPILLARY: Glucose-Capillary: 127 mg/dL — ABNORMAL HIGH (ref 65–99)

## 2016-02-26 SURGERY — COLONOSCOPY
Anesthesia: Moderate Sedation

## 2016-02-26 MED ORDER — SODIUM CHLORIDE 0.9 % IV SOLN
INTRAVENOUS | Status: DC
  Administered 2016-02-26: 08:00:00 via INTRAVENOUS

## 2016-02-26 MED ORDER — ONDANSETRON HCL 4 MG/2ML IJ SOLN
INTRAMUSCULAR | Status: AC
  Filled 2016-02-26: qty 2

## 2016-02-26 MED ORDER — MEPERIDINE HCL 100 MG/ML IJ SOLN
INTRAMUSCULAR | Status: AC
  Filled 2016-02-26: qty 2

## 2016-02-26 MED ORDER — MIDAZOLAM HCL 5 MG/5ML IJ SOLN
INTRAMUSCULAR | Status: AC
  Filled 2016-02-26: qty 10

## 2016-02-26 MED ORDER — MEPERIDINE HCL 100 MG/ML IJ SOLN
INTRAMUSCULAR | Status: DC | PRN
  Administered 2016-02-26: 25 mg via INTRAVENOUS
  Administered 2016-02-26: 50 mg via INTRAVENOUS

## 2016-02-26 MED ORDER — MIDAZOLAM HCL 5 MG/5ML IJ SOLN
INTRAMUSCULAR | Status: AC
  Filled 2016-02-26: qty 5

## 2016-02-26 MED ORDER — STERILE WATER FOR IRRIGATION IR SOLN
Status: DC | PRN
  Administered 2016-02-26: 2.5 mL

## 2016-02-26 MED ORDER — ONDANSETRON HCL 4 MG/2ML IJ SOLN
INTRAMUSCULAR | Status: DC | PRN
  Administered 2016-02-26: 4 mg via INTRAVENOUS

## 2016-02-26 MED ORDER — MIDAZOLAM HCL 5 MG/5ML IJ SOLN
INTRAMUSCULAR | Status: DC | PRN
  Administered 2016-02-26 (×2): 1 mg via INTRAVENOUS
  Administered 2016-02-26 (×2): 2 mg via INTRAVENOUS

## 2016-02-26 NOTE — Op Note (Signed)
[facility] Patient Name: [name] Procedure Date: 02/26/2016 8:45 AM MRN: [id number] Date of Birth: 01/08/1951 Attending MD: Norvel Richards , MD CSN: [id number] Age: 65 Admit Type: Outpatient Procedure:                Colonoscopy with snare polypectomy Indications:              Screening for colorectal malignant neoplasm Providers:                Norvel Richards, MD, Lurline Del, RN, [place] Nails.                            [place], Merchant navy officer Referring MD:              Medicines:                Midazolam 6 mg IV, Meperidine 75 mg IV Complications:            No immediate complications. Estimated Blood Loss:     Estimated blood loss: none. Procedure:                Pre-Anesthesia Assessment:                           - Prior to the procedure, a History and Physical                            was performed, and patient medications and                            allergies were reviewed. The patient's tolerance of                            previous anesthesia was also reviewed. The risks                            and benefits of the procedure and the sedation                            options and risks were discussed with the patient.                            All questions were answered, and informed consent                            was obtained. Prior Anticoagulants: The patient has                            taken no previous anticoagulant or antiplatelet                            agents. ASA Grade Assessment: III - A patient with                            severe systemic disease. After reviewing the risks  and benefits, the patient was deemed in                            satisfactory condition to undergo the procedure.                           After obtaining informed consent, the colonoscope                            was passed under direct vision. Throughout the                            procedure, the patient's blood pressure,  pulse, and                            oxygen saturations were monitored continuously. The                            [id number] scope was introduced through                            the anus and advanced to the the cecum, identified                            by appendiceal orifice and ileocecal valve. The                            colonoscopy was performed without difficulty. The                            patient tolerated the procedure well. The quality                            of the bowel preparation was adequate. The                            ileocecal valve, appendiceal orifice, and rectum                            were photographed. The entire colon was well                            visualized. Scope In: 9:01:16 AM Scope Out: 9:26:44 AM Scope Withdrawal Time: 0 hours 14 minutes 58 seconds  Total Procedure Duration: 0 hours 25 minutes 28 seconds  Findings:      Two semi-pedunculated polyps were found in the transverse colon and       hepatic flexure. The polyps were 6 to 8 mm in size. These polyps were       removed with a hot snare. Resection and retrieval were complete.       Estimated blood loss: none.      The exam was otherwise without abnormality on direct and retroflexion       views except for colon tortuosity.. Impression:  Colonic polyps removed. Tortuous colon. Moderate Sedation:      Moderate (conscious) sedation was administered by the endoscopy nurse       and supervised by the endoscopist. The following parameters were       monitored: oxygen saturation, heart rate, blood pressure, respiratory       rate, EKG, adequacy of pulmonary ventilation, and response to care.       Total physician intraservice time was 35 minutes. Recommendation:           - Patient has a contact number available for                            emergencies. The signs and symptoms of potential                            delayed complications were discussed with  the                            patient. Return to normal activities tomorrow.                            Written discharge instructions were provided to the                            patient.                           - Resume previous diet.                           - Continue present medications.                           - Repeat colonoscopy date to be determined after                            pending pathology results are reviewed for                            surveillance based on pathology results.                           - Return to GI clinic PRN. Procedure Code(s):        --- Professional ---                           [phone], Colonoscopy, flexible; with removal of                            tumor(s), polyp(s), or other lesion(s) by snare                            technique                           99152, Moderate sedation services provided by the  same physician or other qualified health care                            professional performing the diagnostic or                            therapeutic service that the sedation supports,                            requiring the presence of an independent trained                            observer to assist in the monitoring of the                            patient's level of consciousness and physiological                            status; initial 15 minutes of intraservice time,                            patient age 54 years or older                           [phone], Moderate sedation services; each additional                            15 minutes intraservice time Diagnosis Code(s):        --- Professional ---                           Z12.11, Encounter for screening for malignant                            neoplasm of colon CPT copyright 2016 American Medical Association. All rights reserved. The codes documented in this report are preliminary and upon coder review may  be revised to meet  current compliance requirements. [name]. [name], MD Norvel Richards, MD 02/26/2016 9:38:25 AM This report has been signed electronically. Number of Addenda: 0

## 2016-02-26 NOTE — H&P (Addendum)
@  [id number]   Primary Care Physician:  Purvis Kilts, MD Primary Gastroenterologist:  Dr. Gala Romney  Pre-Procedure History & Physical: HPI:  [name] is a 65 y.o. male is here for a screening colonoscopy. No bowel symptoms. No family history of colon cancer. No prior colonoscopy.  Past Medical History:  Diagnosis Date  . Diabetes mellitus   . Hypercholesteremia   . Peripheral neuropathy ([place])   . Sleep apnea     Past Surgical History:  Procedure Laterality Date  . HIP SURGERY Right   . LEG SURGERY Left     Prior to Admission medications   Medication Sig Start Date End Date Taking? Authorizing Provider  glipiZIDE (GLUCOTROL XL) 10 MG 24 hr tablet Take 10 mg by mouth 2 (two) times daily. 07/20/14  Yes Historical Provider, MD  lisinopril (PRINIVIL,ZESTRIL) 5 MG tablet Take 5 mg by mouth daily. 06/20/14  Yes Historical Provider, MD  naproxen sodium (ANAPROX) 220 MG tablet Take 220 mg by mouth 2 (two) times daily as needed.   Yes Historical Provider, MD  OVER THE COUNTER MEDICATION Joint cream   Yes Historical Provider, MD  pioglitazone (ACTOS) 30 MG tablet Take 30 mg by mouth daily.   Yes Historical Provider, MD  simvastatin (ZOCOR) 10 MG tablet Take 5 mg by mouth every evening.  06/20/14  Yes Historical Provider, MD  aspirin EC 81 MG tablet Take 81 mg by mouth daily.    Historical Provider, MD    Allergies as of 01/20/2016 - Review Complete 01/16/2016  Allergen Reaction Noted  . Motrin [ibuprofen] Other (See Comments) 10/15/2010    History reviewed. No pertinent family history.  Social History   Social History  . Marital status: Married    Spouse name: N/A  . Number of children: N/A  . Years of education: N/A   Occupational History  . Not on file.   Social History Main Topics  . Smoking status: Former Smoker    Types: Cigarettes  . Smokeless tobacco: Never Used  . Alcohol use No  . Drug use: No  . Sexual activity: Not on file   Other Topics Concern  . Not on  file   Social History Narrative  . No narrative on file    Review of Systems: See HPI, otherwise negative ROS  Physical Exam: BP (!) 143/72   Pulse 85   Temp 97.9 F (36.6 C) (Oral)   Resp 17   Ht 5\' 8"  (1.727 m)   Wt (!) 350 lb (158.8 kg)   SpO2 98%   BMI 53.22 kg/m  General:   Obese.Alert,  Well-developed, well-nourished, pleasant and cooperative in NAD Head:  Normocephalic and atraumatic. Lungs:  Clear throughout to auscultation.   No wheezes, crackles, or rhonchi. No acute distress. Heart:  Regular rate and rhythm; no murmurs, clicks, rubs,  or gallops. Abdomen:  Soft, nontender and nondistended. No masses, hepatosplenomegaly or hernias noted. Normal bowel sounds, without guarding, and without rebound.     Impression/Plan: [name] is now here to undergo a screening colonoscopy. First ever average her screening examination   Risks, benefits, limitations, imponderables and alternatives regarding colonoscopy have been reviewed with the patient. Questions have been answered. All parties agreeable.     Notice:  This dictation was prepared with Dragon dictation along with smaller phrase technology. Any transcriptional errors that result from this process are unintentional and may not be corrected upon review.

## 2016-02-26 NOTE — Discharge Instructions (Addendum)
Colonoscopy Discharge Instructions  Read the instructions outlined below and refer to this sheet in the next few weeks. These discharge instructions provide you with general information on caring for yourself after you leave the hospital. Your doctor may also give you specific instructions. While your treatment has been planned according to the most current medical practices available, unavoidable complications occasionally occur. If you have any problems or questions after discharge, call Dr. Gala Romney at [phone]. ACTIVITY  You may resume your regular activity, but move at a slower pace for the next 24 hours.   Take frequent rest periods for the next 24 hours.   Walking will help get rid of the air and reduce the bloated feeling in your belly (abdomen).   No driving for 24 hours (because of the medicine (anesthesia) used during the test).    Do not sign any important legal documents or operate any machinery for 24 hours (because of the anesthesia used during the test).  NUTRITION  Drink plenty of fluids.   You may resume your normal diet as instructed by your doctor.   Begin with a light meal and progress to your normal diet. Heavy or fried foods are harder to digest and may make you feel sick to your stomach (nauseated).   Avoid alcoholic beverages for 24 hours or as instructed.  MEDICATIONS  You may resume your normal medications unless your doctor tells you otherwise.  WHAT YOU CAN EXPECT TODAY  Some feelings of bloating in the abdomen.   Passage of more gas than usual.   Spotting of blood in your stool or on the toilet paper.  IF YOU HAD POLYPS REMOVED DURING THE COLONOSCOPY:  No aspirin products for 7 days or as instructed.   No alcohol for 7 days or as instructed.   Eat a soft diet for the next 24 hours.  FINDING OUT THE RESULTS OF YOUR TEST Not all test results are available during your visit. If your test results are not back during the visit, make an appointment  with your caregiver to find out the results. Do not assume everything is normal if you have not heard from your caregiver or the medical facility. It is important for you to follow up on all of your test results.  SEEK IMMEDIATE MEDICAL ATTENTION IF:  You have more than a spotting of blood in your stool.   Your belly is swollen (abdominal distention).   You are nauseated or vomiting.   You have a temperature over 101.   You have abdominal pain or discomfort that is severe or gets worse throughout the day.     Colon polyp information provided  Further recommendations to follow pending review of pathology report     Colon Polyps Introduction Polyps are tissue growths inside the body. Polyps can grow in many places, including the large intestine (colon). A polyp may be a round bump or a mushroom-shaped growth. You could have one polyp or several. Most colon polyps are noncancerous (benign). However, some colon polyps can become cancerous over time. What are the causes? The exact cause of colon polyps is not known. What increases the risk? This condition is more likely to develop in people who:  Have a family history of colon cancer or colon polyps.  Are older than 9 or older than 45 if they are African American.  Have inflammatory bowel disease, such as ulcerative colitis or Crohn disease.  Are overweight.  Smoke cigarettes.  Do not get enough exercise.  Drink too much alcohol.  Eat a diet that is:  High in fat and red meat.  Low in fiber.  Had childhood cancer that was treated with abdominal radiation. What are the signs or symptoms? Most polyps do not cause symptoms. If you have symptoms, they may include:  Blood coming from your rectum when having a bowel movement.  Blood in your stool.The stool may look dark red or black.  A change in bowel habits, such as constipation or diarrhea. How is this diagnosed? This condition is diagnosed with a colonoscopy.  This is a procedure that uses a lighted, flexible scope to look at the inside of your colon. How is this treated? Treatment for this condition involves removing any polyps that are found. Those polyps will then be tested for cancer. If cancer is found, your health care provider will talk to you about options for colon cancer treatment. Follow these instructions at home: Diet  Eat plenty of fiber, such as fruits, vegetables, and whole grains.  Eat foods that are high in calcium and vitamin D, such as milk, cheese, yogurt, eggs, liver, fish, and broccoli.  Limit foods high in fat, red meats, and processed meats, such as hot dogs, sausage, bacon, and lunch meats.  Maintain a healthy weight, or lose weight if recommended by your health care provider. General instructions  Do not smoke cigarettes.  Do not drink alcohol excessively.  Keep all follow-up visits as told by your health care provider. This is important. This includes keeping regularly scheduled colonoscopies. Talk to your health care provider about when you need a colonoscopy.  Exercise every day or as told by your health care provider. Contact a health care provider if:  You have new or worsening bleeding during a bowel movement.  You have new or increased blood in your stool.  You have a change in bowel habits.  You unexpectedly lose weight. This information is not intended to replace advice given to you by your health care provider. Make sure you discuss any questions you have with your health care provider. Document Released: 12/25/2003 Document Revised: 09/05/2015 Document Reviewed: 02/18/2015  2017 Elsevier

## 2016-02-27 ENCOUNTER — Encounter: Payer: Self-pay | Admitting: Internal Medicine

## 2016-02-28 ENCOUNTER — Encounter (HOSPITAL_COMMUNITY): Payer: Self-pay | Admitting: Internal Medicine

## 2016-04-22 ENCOUNTER — Telehealth: Payer: Self-pay | Admitting: Internal Medicine

## 2016-04-22 NOTE — Telephone Encounter (Signed)
No diverticulosis on colonoscopy. Needs office visit. May use Miralax daily to BID for constipation.

## 2016-04-22 NOTE — Telephone Encounter (Signed)
Pt had colonoscopy in November and said everything was fine and he felt fine. He said recently he has started having much problems and didn't know what to do. Please advise if he needs OV or if something can be called in for him. I transferred call to JL VM

## 2016-04-22 NOTE — Telephone Encounter (Signed)
Spoke with the pt, he said he was doing fine until recently he started having some discomfort in his abd that has progressively gotten worse. He said it was mainly on his L side but sometimes it was in the middle of his abd, and sometimes near his kidney area. No N/V, no fever, no diarrhea, no blood in his stool. pts bm's have been hard and he only has a bm every 2-3 days. Urinating seems to be ok. He has not tried anything otc, but he did purchase some miralax today and some azo in case it was a urinary issue. Pt has a tcs a few months ago. He is going to see his pcp for a yearly physical next week.  Vicente Males, can you look at this in RMR absence and see if you have any further recommendations.

## 2016-04-23 NOTE — Telephone Encounter (Signed)
Pt is aware. He wants to try the miralax first and if he is still having problems he will call back and make an appointment.

## 2016-10-18 ENCOUNTER — Encounter (HOSPITAL_COMMUNITY): Payer: Self-pay

## 2016-10-18 ENCOUNTER — Inpatient Hospital Stay (HOSPITAL_COMMUNITY)
Admit: 2016-10-18 | Discharge: 2016-10-24 | DRG: 603 | Disposition: A | Discharge status: Home / Self Care | Payer: Medicare Other | Source: Other Acute Inpatient Hospital | Attending: Internal Medicine | Admitting: Internal Medicine

## 2016-10-18 DIAGNOSIS — Z87891 Personal history of nicotine dependence: Secondary | ICD-10-CM | POA: Diagnosis not present

## 2016-10-18 DIAGNOSIS — Z79899 Other long term (current) drug therapy: Secondary | ICD-10-CM

## 2016-10-18 DIAGNOSIS — E1142 Type 2 diabetes mellitus with diabetic polyneuropathy: Secondary | ICD-10-CM | POA: Diagnosis present

## 2016-10-18 DIAGNOSIS — Z7984 Long term (current) use of oral hypoglycemic drugs: Secondary | ICD-10-CM | POA: Diagnosis not present

## 2016-10-18 DIAGNOSIS — G4733 Obstructive sleep apnea (adult) (pediatric): Secondary | ICD-10-CM | POA: Diagnosis present

## 2016-10-18 DIAGNOSIS — Z6841 Body Mass Index (BMI) 40.0 and over, adult: Secondary | ICD-10-CM

## 2016-10-18 DIAGNOSIS — G4730 Sleep apnea, unspecified: Secondary | ICD-10-CM | POA: Diagnosis not present

## 2016-10-18 DIAGNOSIS — E1149 Type 2 diabetes mellitus with other diabetic neurological complication: Secondary | ICD-10-CM | POA: Diagnosis not present

## 2016-10-18 DIAGNOSIS — L03116 Cellulitis of left lower limb: Principal | ICD-10-CM | POA: Diagnosis present

## 2016-10-18 DIAGNOSIS — E7800 Pure hypercholesterolemia, unspecified: Secondary | ICD-10-CM | POA: Diagnosis present

## 2016-10-18 DIAGNOSIS — E119 Type 2 diabetes mellitus without complications: Secondary | ICD-10-CM | POA: Diagnosis not present

## 2016-10-18 DIAGNOSIS — Z7982 Long term (current) use of aspirin: Secondary | ICD-10-CM

## 2016-10-18 LAB — COMPREHENSIVE METABOLIC PANEL
ALBUMIN: 2.5 g/dL — AB (ref 3.5–5.0)
ALT: 22 U/L (ref 17–63)
ANION GAP: 9 (ref 5–15)
AST: 25 U/L (ref 15–41)
Alkaline Phosphatase: 76 U/L (ref 38–126)
BILIRUBIN TOTAL: 0.7 mg/dL (ref 0.3–1.2)
BUN: 17 mg/dL (ref 6–20)
CO2: 28 mmol/L (ref 22–32)
Calcium: 8.7 mg/dL — ABNORMAL LOW (ref 8.9–10.3)
Chloride: 105 mmol/L (ref 101–111)
Creatinine, Ser: 1.11 mg/dL (ref 0.61–1.24)
GFR calc Af Amer: >60 mL/min (ref >60)
GFR calc non Af Amer: >60 mL/min (ref >60)
GLUCOSE: 177 mg/dL — AB (ref 65–99)
POTASSIUM: 4.1 mmol/L (ref 3.5–5.1)
SODIUM: 142 mmol/L (ref 135–145)
TOTAL PROTEIN: 7.2 g/dL (ref 6.5–8.1)

## 2016-10-18 LAB — CBC WITH DIFFERENTIAL/PLATELET
BASOS ABS: 0.1 10*3/uL (ref 0.0–0.1)
BASOS PCT: 1 %
EOS ABS: 0.2 10*3/uL (ref 0.0–0.7)
EOS PCT: 2 %
HEMATOCRIT: 41 % (ref 39.0–52.0)
Hemoglobin: 13.6 g/dL (ref 13.0–17.0)
Lymphocytes Relative: 12 %
Lymphs Abs: 1.5 10*3/uL (ref 0.7–4.0)
MCH: 29.8 pg (ref 26.0–34.0)
MCHC: 33.2 g/dL (ref 30.0–36.0)
MCV: 89.7 fL (ref 78.0–100.0)
MONOS PCT: 11 %
Monocytes Absolute: 1.4 10*3/uL — ABNORMAL HIGH (ref 0.1–1.0)
Neutro Abs: 10 10*3/uL — ABNORMAL HIGH (ref 1.7–7.7)
Neutrophils Relative %: 76 %
PLATELETS: 233 10*3/uL (ref 150–400)
RBC: 4.57 MIL/uL (ref 4.22–5.81)
RDW: 14.1 % (ref 11.5–15.5)
WBC: 13.2 10*3/uL — ABNORMAL HIGH (ref 4.0–10.5)

## 2016-10-18 LAB — GLUCOSE, CAPILLARY
Glucose-Capillary: 163 mg/dL — ABNORMAL HIGH (ref 65–99)
Glucose-Capillary: 216 mg/dL — ABNORMAL HIGH (ref 65–99)

## 2016-10-18 MED ORDER — INSULIN ASPART 100 UNIT/ML ~~LOC~~ SOLN
0.0000 [IU] | Freq: Three times a day (TID) | SUBCUTANEOUS | Status: DC
  Administered 2016-10-19: 7 [IU] via SUBCUTANEOUS
  Administered 2016-10-19 (×2): 4 [IU] via SUBCUTANEOUS
  Administered 2016-10-20: 7 [IU] via SUBCUTANEOUS
  Administered 2016-10-20: 3 [IU] via SUBCUTANEOUS
  Administered 2016-10-20: 7 [IU] via SUBCUTANEOUS
  Administered 2016-10-21: 3 [IU] via SUBCUTANEOUS
  Administered 2016-10-21: 7 [IU] via SUBCUTANEOUS
  Administered 2016-10-21: 3 [IU] via SUBCUTANEOUS
  Administered 2016-10-22: 4 [IU] via SUBCUTANEOUS
  Administered 2016-10-22: 3 [IU] via SUBCUTANEOUS
  Administered 2016-10-22 – 2016-10-23 (×3): 7 [IU] via SUBCUTANEOUS
  Administered 2016-10-23: 3 [IU] via SUBCUTANEOUS
  Administered 2016-10-24: 11 [IU] via SUBCUTANEOUS
  Administered 2016-10-24: 3 [IU] via SUBCUTANEOUS

## 2016-10-18 MED ORDER — SODIUM CHLORIDE 0.45 % IV SOLN
INTRAVENOUS | Status: DC
  Administered 2016-10-18 – 2016-10-19 (×3): via INTRAVENOUS

## 2016-10-18 MED ORDER — INSULIN ASPART 100 UNIT/ML ~~LOC~~ SOLN
0.0000 [IU] | Freq: Every day | SUBCUTANEOUS | Status: DC
  Administered 2016-10-18 – 2016-10-21 (×2): 2 [IU] via SUBCUTANEOUS

## 2016-10-18 MED ORDER — SIMVASTATIN 10 MG PO TABS
5.0000 mg | ORAL_TABLET | Freq: Every evening | ORAL | Status: DC
  Administered 2016-10-18 – 2016-10-23 (×6): 5 mg via ORAL
  Filled 2016-10-18 (×6): qty 1

## 2016-10-18 MED ORDER — GLIPIZIDE ER 5 MG PO TB24
10.0000 mg | ORAL_TABLET | Freq: Two times a day (BID) | ORAL | Status: DC
  Administered 2016-10-18 – 2016-10-24 (×10): 10 mg via ORAL
  Filled 2016-10-18 (×11): qty 2

## 2016-10-18 MED ORDER — VANCOMYCIN HCL 10 G IV SOLR
1250.0000 mg | Freq: Two times a day (BID) | INTRAVENOUS | Status: DC
  Filled 2016-10-18 (×3): qty 1250

## 2016-10-18 MED ORDER — ONDANSETRON HCL 4 MG/2ML IJ SOLN: 4.0000 mg | Freq: Four times a day (QID) | INTRAMUSCULAR | Status: DC | PRN

## 2016-10-18 MED ORDER — ONDANSETRON HCL 4 MG PO TABS: 4.0000 mg | ORAL_TABLET | Freq: Four times a day (QID) | ORAL | Status: DC | PRN

## 2016-10-18 MED ORDER — MORPHINE SULFATE (PF) 4 MG/ML IV SOLN
4.0000 mg | INTRAVENOUS | Status: AC | PRN
  Administered 2016-10-18 (×2): 4 mg via INTRAVENOUS
  Filled 2016-10-18 (×2): qty 1

## 2016-10-18 MED ORDER — ASPIRIN EC 81 MG PO TBEC
81.0000 mg | DELAYED_RELEASE_TABLET | Freq: Every day | ORAL | Status: DC
  Administered 2016-10-18 – 2016-10-24 (×7): 81 mg via ORAL
  Filled 2016-10-18 (×7): qty 1

## 2016-10-18 MED ORDER — PIPERACILLIN-TAZOBACTAM 3.375 G IVPB
3.3750 g | Freq: Three times a day (TID) | INTRAVENOUS | Status: DC
  Administered 2016-10-18 – 2016-10-23 (×14): 3.375 g via INTRAVENOUS
  Filled 2016-10-18 (×12): qty 50

## 2016-10-18 MED ORDER — PIOGLITAZONE HCL 15 MG PO TABS
30.0000 mg | ORAL_TABLET | Freq: Every day | ORAL | Status: DC
  Administered 2016-10-19 – 2016-10-24 (×6): 30 mg via ORAL
  Filled 2016-10-18 (×2): qty 1
  Filled 2016-10-18 (×7): qty 2

## 2016-10-18 MED ORDER — ENOXAPARIN SODIUM 30 MG/0.3ML ~~LOC~~ SOLN
30.0000 mg | SUBCUTANEOUS | Status: DC
  Administered 2016-10-18: 30 mg via SUBCUTANEOUS
  Filled 2016-10-18: qty 0.3

## 2016-10-18 MED ORDER — LISINOPRIL 5 MG PO TABS
5.0000 mg | ORAL_TABLET | Freq: Every day | ORAL | Status: DC
  Administered 2016-10-19 – 2016-10-24 (×6): 5 mg via ORAL
  Filled 2016-10-18 (×6): qty 1

## 2016-10-18 MED ORDER — OXYCODONE HCL 5 MG PO TABS
5.0000 mg | ORAL_TABLET | ORAL | Status: DC | PRN
  Administered 2016-10-18 – 2016-10-24 (×15): 5 mg via ORAL
  Filled 2016-10-18 (×16): qty 1

## 2016-10-18 MED ORDER — VANCOMYCIN HCL 10 G IV SOLR
1500.0000 mg | Freq: Once | INTRAVENOUS | Status: AC
  Administered 2016-10-18: 1500 mg via INTRAVENOUS
  Filled 2016-10-18 (×2): qty 1500

## 2016-10-18 MED ORDER — VANCOMYCIN HCL IN DEXTROSE 1-5 GM/200ML-% IV SOLN
1000.0000 mg | Freq: Once | INTRAVENOUS | Status: AC
  Administered 2016-10-18: 1000 mg via INTRAVENOUS
  Filled 2016-10-18: qty 200

## 2016-10-18 NOTE — Progress Notes (Signed)
Pharmacy Antibiotic Note  Mrk Buzby is a 66 y.o. male admitted on 10/18/2016 with cellulitis.  Pharmacy has been consulted for Vancomycin and Zosyn dosing. Obese patient Calculated normalized CrCl 65.5 ml/min  Plan: Vancomycin 2500 mg IV loading dose, then Vancomycin 1250 mg Iv every 12 hours Zosyn 3.375 GM IV every 8 hours Vancomycin trough at steady state Labs per protocol  Height: 5\' 8"  (172.7 cm) Weight: (!) 377 lb 9.6 oz (171.3 kg) IBW/kg (Calculated) : 68.4  Temp (24hrs), Avg:97.5 F (36.4 C), Min:97.5 F (36.4 C), Max:97.5 F (36.4 C)   Recent Labs Lab 10/18/16 1642  WBC 13.2*  CREATININE 1.11    Estimated Creatinine Clearance: 100.1 mL/min (by C-G formula based on SCr of 1.11 mg/dL).    Allergies  Allergen Reactions  . Metformin And Related Other (See Comments)    Stomach pain, constipation  . Motrin [Ibuprofen] Other (See Comments)    Possible reaction resulting in "feeling like bugs crawling all over me"    Antimicrobials this admission: Vancomycin  7/8 >>  Zosyn 7/8 >>   Dose adjustments this admission  Thank you for allowing pharmacy to be a part of this patient's care.  Chriss Czar 10/18/2016 7:55 PM

## 2016-10-18 NOTE — H&P (Signed)
History and Physical    [name] GGE:[id number] DOB: Jan 09, 1951 DOA: 10/18/2016  PCP: Sharilyn Sites, MD   Patient coming from: Home.  I have personally briefly reviewed patient's old medical records in [place]  Chief Complaint: Left lower extremity infection.  HPI: [name] is a 66 y.o. male with medical history significant of type 2 diabetes, hypercholesterolemia, peripheral neuropathy, sleep apnea who is coming from [facility], where he was admitted for the past 4 days for continuation of treatment left lower extremity cellulitis. WBC has been progressively decreasing, but the patient states that the edema and tenderness have not improved much. He denies trauma to the area, fever or chills. He denies sore throat, productive cough, chest pain, palpitations, dizziness, diaphoresis, PND, orthopnea, abdominal pain, nausea, emesis, diarrhea, constipation, melena or hematochezia. He denies dysuria, frequency or hematuria.  ED Course: Does not apply.  Review of Systems: As per HPI otherwise 10 point review of systems negative.    Past Medical History:  Diagnosis Date  . Diabetes mellitus   . Hypercholesteremia   . Peripheral neuropathy   . Sleep apnea     Past Surgical History:  Procedure Laterality Date  . COLONOSCOPY N/A 02/26/2016   Procedure: COLONOSCOPY;  Surgeon: Daneil Dolin, MD;  Location: [facility];  Service: Endoscopy;  Laterality: N/A;  8:30 Am  . HIP SURGERY Right   . LEG SURGERY Left      reports that he has quit smoking. His smoking use included Cigarettes. He has never used smokeless tobacco. He reports that he does not drink alcohol or use drugs.  Allergies  Allergen Reactions  . Metformin And Related Other (See Comments)    Stomach pain, constipation  . Motrin [Ibuprofen] Other (See Comments)    Possible reaction resulting in "feeling like bugs crawling all over me"    Family History  Problem Relation Age of Onset  . Lung  cancer Mother   . Hiatal hernia Mother   . Heart Problems Father        Pacemaker  . CAD Father   . Benign prostatic hyperplasia Father     Prior to Admission medications   Medication Sig Start Date End Date Taking? Authorizing Provider  aspirin EC 81 MG tablet Take 81 mg by mouth daily.    [provider]  glipiZIDE (GLUCOTROL XL) 10 MG 24 hr tablet Take 10 mg by mouth 2 (two) times daily. 07/20/14   [provider]  lisinopril (PRINIVIL,ZESTRIL) 5 MG tablet Take 5 mg by mouth daily. 06/20/14   [provider]  naproxen sodium (ANAPROX) 220 MG tablet Take 220 mg by mouth 2 (two) times daily as needed.    [provider]  OVER THE COUNTER MEDICATION Joint cream    [provider]  pioglitazone (ACTOS) 30 MG tablet Take 30 mg by mouth daily.    [provider]  simvastatin (ZOCOR) 10 MG tablet Take 5 mg by mouth every evening.  06/20/14   [provider]    Physical Exam: Vitals:   10/18/16 1600  BP: (!) 154/74  Pulse: 87  Resp: 20  Temp: (!) 97.5 F (36.4 C)  SpO2: 98%  Height: 5\' 8"  (1.727 m)    Constitutional: NAD, calm, comfortable Eyes: PERRL, lids and conjunctivae normal ENMT: Mucous membranes are moist. Posterior pharynx clear of any exudate or lesions. Neck: normal, supple, no masses, no thyromegaly Respiratory: clear to auscultation bilaterally, no wheezing, no crackles. Normal respiratory effort. No  accessory muscle use.  Cardiovascular: Regular rate and rhythm, no murmurs / rubs / gallops. Bilateral lymphedema, left lower extremity pitting edema. 2+ pedal pulses. No carotid bruits.  Abdomen: Obese, soft, no tenderness, no masses palpated. No hepatosplenomegaly. Bowel sounds positive.  Musculoskeletal: no clubbing / cyanosis. No joint deformity upper and lower extremities. Good ROM, no contractures. Normal muscle tone.  Skin: Positive erythema, induration, calor, edema and tenderness to palpation, left lower  extremity. Please see below pictures for further detail. Neurologic: CN 2-12 grossly intact. Sensation intact, DTR normal. Strength 5/5 in all 4.  Psychiatric: Normal judgment and insight. Alert and oriented x 3. Normal mood.        Labs on Admission: I have personally reviewed following labs and imaging studies  CBC:  Recent Labs Lab 10/18/16 1642  WBC 13.2*  NEUTROABS 10.0*  HGB 13.6  HCT 41.0  MCV 89.7  PLT 546   Basic Metabolic Panel:  Recent Labs Lab 10/18/16 1642  NA 142  K 4.1  CL 105  CO2 28  GLUCOSE 177*  BUN 17  CREATININE 1.11  CALCIUM 8.7*   GFR: CrCl cannot be calculated (Unknown ideal weight.). Liver Function Tests:  Recent Labs Lab 10/18/16 1642  AST 25  ALT 22  ALKPHOS 76  BILITOT 0.7  PROT 7.2  ALBUMIN 2.5*   No results for input(s): LIPASE, AMYLASE in the last 168 hours. No results for input(s): AMMONIA in the last 168 hours. Coagulation Profile: No results for input(s): INR, PROTIME in the last 168 hours. Cardiac Enzymes: No results for input(s): CKTOTAL, CKMB, CKMBINDEX, TROPONINI in the last 168 hours. BNP (last 3 results) No results for input(s): PROBNP in the last 8760 hours. HbA1C: No results for input(s): HGBA1C in the last 72 hours. CBG:  Recent Labs Lab 10/18/16 1640  GLUCAP 163*   Lipid Profile: No results for input(s): CHOL, HDL, LDLCALC, TRIG, CHOLHDL, LDLDIRECT in the last 72 hours. Thyroid Function Tests: No results for input(s): TSH, T4TOTAL, FREET4, T3FREE, THYROIDAB in the last 72 hours. Anemia Panel: No results for input(s): VITAMINB12, FOLATE, FERRITIN, TIBC, IRON, RETICCTPCT in the last 72 hours. Urine analysis: No results found for: COLORURINE, APPEARANCEUR, LABSPEC, PHURINE, GLUCOSEU, HGBUR, BILIRUBINUR, KETONESUR, PROTEINUR, UROBILINOGEN, NITRITE, LEUKOCYTESUR  Radiological Exams on Admission: No results found.  EKG: Independently reviewed.  Assessment/Plan Principal Problem:   Cellulitis of  left lower extremity Admit to MedSurg/inpatient. Continue vancomycin per pharmacy. Start Zosyn per pharmacy. Continue analgesics as needed. Tight blood glucose control. Consider general surgery evaluation if no improvement.  Active Problems:   Diabetes mellitus Carbohydrate modified diet. Continue glipizide XL 10 mg by mouth twice a day. Continue Actos 30 mg by mouth daily. CBG monitoring her regular insulin sliding scale in the hospital.    Hypercholesteremia Continue simvastatin 5 mg by mouth daily in the evening. Monitor hepatic function panel as needed. Fasting lipid profile monitoring as an outpatient with PCP.    Sleep apnea Continue CPAP at bedtime.    DVT prophylaxis: Lovenox SQ. Code Status: Full code. Family Communication: His wife was present in the floor room. Disposition Plan: Admit for IV antibiotic therapy for several days. Consults called:  Admission status: Inpatient/MedSurg.   Reubin Milan MD [facility] Pager [phone].  If 7PM-7AM, please contact night-coverage [email or web address] Password [facility]  10/18/2016, 6:57 PM

## 2016-10-19 ENCOUNTER — Inpatient Hospital Stay (HOSPITAL_COMMUNITY): Payer: Medicare Other

## 2016-10-19 DIAGNOSIS — E1149 Type 2 diabetes mellitus with other diabetic neurological complication: Secondary | ICD-10-CM

## 2016-10-19 DIAGNOSIS — G4730 Sleep apnea, unspecified: Secondary | ICD-10-CM

## 2016-10-19 LAB — BASIC METABOLIC PANEL
Anion gap: 9 (ref 5–15)
BUN: 14 mg/dL (ref 6–20)
CALCIUM: 8.4 mg/dL — AB (ref 8.9–10.3)
CO2: 30 mmol/L (ref 22–32)
CREATININE: 1.11 mg/dL (ref 0.61–1.24)
Chloride: 99 mmol/L — ABNORMAL LOW (ref 101–111)
GFR calc Af Amer: >60 mL/min (ref >60)
GLUCOSE: 176 mg/dL — AB (ref 65–99)
Potassium: 4.6 mmol/L (ref 3.5–5.1)
Sodium: 138 mmol/L (ref 135–145)

## 2016-10-19 LAB — GLUCOSE, CAPILLARY
GLUCOSE-CAPILLARY: 171 mg/dL — AB (ref 65–99)
GLUCOSE-CAPILLARY: 147 mg/dL — AB (ref 65–99)
Glucose-Capillary: 157 mg/dL — ABNORMAL HIGH (ref 65–99)
Glucose-Capillary: 203 mg/dL — ABNORMAL HIGH (ref 65–99)

## 2016-10-19 MED ORDER — VANCOMYCIN HCL 10 G IV SOLR
1250.0000 mg | Freq: Two times a day (BID) | INTRAVENOUS | Status: DC
  Administered 2016-10-19 – 2016-10-20 (×3): 1250 mg via INTRAVENOUS
  Filled 2016-10-19 (×4): qty 1250

## 2016-10-19 MED ORDER — IOPAMIDOL (ISOVUE-300) INJECTION 61%
100.0000 mL | Freq: Once | INTRAVENOUS | Status: AC | PRN
  Administered 2016-10-19: 100 mL via INTRAVENOUS

## 2016-10-19 MED ORDER — MORPHINE SULFATE (PF) 4 MG/ML IV SOLN
4.0000 mg | INTRAVENOUS | Status: DC | PRN
  Administered 2016-10-20 – 2016-10-23 (×7): 4 mg via INTRAVENOUS
  Filled 2016-10-19 (×7): qty 1

## 2016-10-19 MED ORDER — ENOXAPARIN SODIUM 80 MG/0.8ML ~~LOC~~ SOLN
80.0000 mg | SUBCUTANEOUS | Status: DC
  Administered 2016-10-19 – 2016-10-23 (×5): 80 mg via SUBCUTANEOUS
  Filled 2016-10-19 (×4): qty 0.8

## 2016-10-19 NOTE — Progress Notes (Signed)
PROGRESS NOTE  [name] DDU:[id number] DOB: 01-12-1951 DOA: 10/18/2016 PCP: Sharilyn Sites, MD  Brief Narrative: 66 year old man PMH diabetes mellitus type 2, presented from an outside hospital as a transfer for left lower extremity cellulitis and desired to be closer to home. Admitted for further treatment of cellulitis.  Assessment/Plan Left lower extremity cellulitis, according to records present since 7/3 without significant improvement on 2 different cephalosporins and vancomycin. Remains tender to palpation and erythematous. Upper leg is uninvolved and groin appears unremarkable. Outside hospital records indicate left lower extremity was negative for DVT in left ankle film showed no acute abnormalities. -Morbid obesity factor, however concerning that the patient is feeling to improve on antibiotics. Continue Zosyn and vancomycin at this point. -He's had multiple surgeries on his left leg and has hardware in place. Will proceed with CT of the left lower leg to exclude complicating features such as abscess. Perfusion of the foot on the left is intact.  Diabetes mellitus type 2 -Continue sliding scale insulin  Obstructive sleep apnea. -Continue CPAP at night.  DVT prophylaxis: enoxaparin  Code Status: full Family Communication: none Disposition Plan: home    Murray Hodgkins, MD  [facility] Direct contact: [phone] --[place] [place]  --[email or web address]; password [email or web address]  7PM-7AM contact night coverage as above 10/19/2016, 1:25 PM  LOS: 1 day   Consultants:    Procedures:    Antimicrobials:  Zosyn 7/8 >>  Vancomycin 7/8 >>  Interval history/Subjective: Complains of ongoing left leg pain and erythema without significant change. No other complaints.  Objective: Vitals: Afebrile, 97.6, 20, 94, 169/98, 98% on room air  Exam:     Constitutional: Appears calm, comfortable  Eyes. Pupils, irises, lids appear normal.  ENT: Grossly normal hearing,  lips.  Cardiovascular. Regular rate and rhythm. No murmur, rub or gallop. 1+ right lower extremity edema. 2+ left lower extremity edema.  Respiratory. Clear to auscultation bilaterally. No wheezes, rales or rhonchi. Normal respiratory effort.  Abdomen soft, nontender, nondistended, obese  Skin: Right lower extreme has chronic venous stasis changes. Left lower extremity has diffuse erythema over the lower leg to the ankle. Tender to palpation. There is warmth but no fluctuance. No open wound or drainage is noted. Knee is uninvolved. Thigh nontender. Groin appears unremarkable on the left.  Musculoskeletal. Strength in the left leg limited by pain.  Psychiatric. Alert. Speech fluent and clear.   I have personally reviewed the following:   Labs:  Blood sugars stable  Basic metabolic panel unremarkable  CBC on admission noted  Imaging studies:    Medical tests:     Test discussed with performing physician:    Decision to obtain old records:    Review and summation of old records:  [facility] 7/3-7/7 discharge summary reviewed  Treated for sepsis, left leg cellulitis. Started on ceftaroline with no clinical improvement after 48 hours and therefore changed to vancomycin. Cefazolin added 7/7.  Left ankle x-ray 3 views 7/3: No fracture. 2 screws distal fibular shaft.   Left lower extremity venous ultrasound no evidence of DVT.  Scheduled Meds: . aspirin EC  81 mg Oral Daily  . enoxaparin (LOVENOX) injection  80 mg Subcutaneous Q24H  . glipiZIDE  10 mg Oral BID  . insulin aspart  0-20 Units Subcutaneous TID WC  . insulin aspart  0-5 Units Subcutaneous QHS  . lisinopril  5 mg Oral Daily  . pioglitazone  30 mg Oral Daily  . simvastatin  5 mg Oral QPM  Continuous Infusions: . piperacillin-tazobactam (ZOSYN)  IV 3.375 g (10/19/16 1245)  . vancomycin      Principal Problem:   Cellulitis of left lower extremity Active Problems:   Diabetes  mellitus   Sleep apnea   LOS: 1 day

## 2016-10-20 DIAGNOSIS — E119 Type 2 diabetes mellitus without complications: Secondary | ICD-10-CM

## 2016-10-20 LAB — CBC
HCT: 37.1 % — ABNORMAL LOW (ref 39.0–52.0)
Hemoglobin: 11.8 g/dL — ABNORMAL LOW (ref 13.0–17.0)
MCH: 28.6 pg (ref 26.0–34.0)
MCHC: 31.8 g/dL (ref 30.0–36.0)
MCV: 90 fL (ref 78.0–100.0)
PLATELETS: 232 10*3/uL (ref 150–400)
RBC: 4.12 MIL/uL — ABNORMAL LOW (ref 4.22–5.81)
RDW: 14.1 % (ref 11.5–15.5)
WBC: 12.8 10*3/uL — ABNORMAL HIGH (ref 4.0–10.5)

## 2016-10-20 LAB — GLUCOSE, CAPILLARY
Glucose-Capillary: 206 mg/dL — ABNORMAL HIGH (ref 65–99)
Glucose-Capillary: 221 mg/dL — ABNORMAL HIGH (ref 65–99)
Glucose-Capillary: 134 mg/dL — ABNORMAL HIGH (ref 65–99)
Glucose-Capillary: 181 mg/dL — ABNORMAL HIGH (ref 65–99)

## 2016-10-20 NOTE — Progress Notes (Signed)
Inpatient Diabetes Program Recommendations  AACE/ADA: New Consensus Statement on Inpatient Glycemic Control (2015)  Target Ranges:  Prepandial:   less than 140 mg/dL      Peak postprandial:   less than 180 mg/dL (1-2 hours)      Critically ill patients:  140 - 180 mg/dL   Lab Results  Component Value Date   GLUCAP 206 (H) 10/20/2016   HGBA1C 7.4 (H) 10/14/2010   7/10 If fasting blood sugar continue to be elevated, please consider adding Lantus.  [place], CDE. M.Ed. Pager [phone] Inpatient Diabetes Coordinator

## 2016-10-20 NOTE — Progress Notes (Signed)
  PROGRESS NOTE  [name] QAS:[id number] DOB: 09-16-1950 DOA: 10/18/2016 PCP: Sharilyn Sites, MD  Brief Narrative: 66 year old man PMH diabetes mellitus type 2, presented from an outside hospital as a transfer for left lower extremity cellulitis and desired to be closer to home. Admitted for further treatment of cellulitis.  Assessment/Plan Left lower extremity cellulitis, according to records present since 7/3 without significant improvement on 2 different cephalosporins and vancomycin. Remains tender to palpation and erythematous. Upper leg is uninvolved and groin appears unremarkable. Outside hospital records indicate left lower extremity was negative for DVT in left ankle film showed no acute abnormalities. -Improving on Zosyn and vancomycin. CT without evidence of complicating features. Continue current treatment.  Diabetes mellitus type 2 -Stable, continue sliding scale insulin  Obstructive sleep apnea. -Continue CPAP  DVT prophylaxis: enoxaparin  Code Status: full Family Communication: none Disposition Plan: home    Murray Hodgkins, MD  [facility] Direct contact: [phone] --[place] [place]  --[email or web address]; password [email or web address]  7PM-7AM contact night coverage as above 10/20/2016, 6:14 PM  LOS: 2 days   Consultants:    Procedures:    Antimicrobials:  Zosyn 7/8 >>  Vancomycin 7/8 >>  Interval history/Subjective: Still having pain in the leg but the redness is a bit better.  Objective: Vitals: 97.5, 18, 90, 153/74, 99% on room air  Exam:     Constitutional. Appears calm, comfortable.  Cardiovascular. Regular rate and rhythm. No murmur, rub or gallop. No change in left lower extremity edema.  Respiratory. Clear to auscultation bilaterally. No wheezes, rales or rhonchi. Normal respiratory effort.  Skin. Decreasing erythema left lower extremity. Still tender to touch. Decreased warmth. Overall appears improved.   Psychiatric. Grossly normal mood  and affect. Speech fluent and appropriate.   I have personally reviewed the following:   Labs: Blood sugars remain stable   WBC slightly improved, 12.8. Remainder CBC unremarkable.  Imaging studies:    Medical tests:     Test discussed with performing physician:    Decision to obtain old records:    Review and summation of old records:  [facility] 7/3-7/7 discharge summary reviewed  Treated for sepsis, left leg cellulitis. Started on ceftaroline with no clinical improvement after 48 hours and therefore changed to vancomycin. Cefazolin added 7/7.  Left ankle x-ray 3 views 7/3: No fracture. 2 screws distal fibular shaft.   Left lower extremity venous ultrasound no evidence of DVT.  Scheduled Meds: . aspirin EC  81 mg Oral Daily  . enoxaparin (LOVENOX) injection  80 mg Subcutaneous Q24H  . glipiZIDE  10 mg Oral BID  . insulin aspart  0-20 Units Subcutaneous TID WC  . insulin aspart  0-5 Units Subcutaneous QHS  . lisinopril  5 mg Oral Daily  . pioglitazone  30 mg Oral Daily  . simvastatin  5 mg Oral QPM   Continuous Infusions: . piperacillin-tazobactam (ZOSYN)  IV Stopped (10/20/16 1659)  . vancomycin 1,250 mg (10/20/16 1712)    Principal Problem:   Cellulitis of left lower extremity Active Problems:   Diabetes mellitus   Sleep apnea   LOS: 2 days

## 2016-10-21 DIAGNOSIS — L03116 Cellulitis of left lower limb: Principal | ICD-10-CM

## 2016-10-21 LAB — BASIC METABOLIC PANEL
ANION GAP: 9 (ref 5–15)
BUN: 14 mg/dL (ref 6–20)
CALCIUM: 9 mg/dL (ref 8.9–10.3)
CO2: 28 mmol/L (ref 22–32)
CREATININE: 1.16 mg/dL (ref 0.61–1.24)
Chloride: 100 mmol/L — ABNORMAL LOW (ref 101–111)
Glucose, Bld: 130 mg/dL — ABNORMAL HIGH (ref 65–99)
Potassium: 4.5 mmol/L (ref 3.5–5.1)
SODIUM: 137 mmol/L (ref 135–145)

## 2016-10-21 LAB — GLUCOSE, CAPILLARY
GLUCOSE-CAPILLARY: 124 mg/dL — AB (ref 65–99)
GLUCOSE-CAPILLARY: 240 mg/dL — AB (ref 65–99)
Glucose-Capillary: 140 mg/dL — ABNORMAL HIGH (ref 65–99)
Glucose-Capillary: 242 mg/dL — ABNORMAL HIGH (ref 65–99)

## 2016-10-21 LAB — VANCOMYCIN, TROUGH: VANCOMYCIN TR: 20 ug/mL (ref 15–20)

## 2016-10-21 MED ORDER — VANCOMYCIN HCL 10 G IV SOLR
2000.0000 mg | INTRAVENOUS | Status: DC
  Administered 2016-10-21 – 2016-10-23 (×3): 2000 mg via INTRAVENOUS
  Filled 2016-10-21 (×6): qty 2000

## 2016-10-21 NOTE — Plan of Care (Signed)
Problem: Pain Managment: Goal: General experience of comfort will improve Outcome: Adequate for Discharge Pt  Has no c/o pain so far this shift. Pt has received pain medication on day shift, but rates his pain 2/10 and states he doesn't need anything. Pt educated on pain mgmt as well as medications available. Pt verbalized understanding. Will continue to monitor pt

## 2016-10-21 NOTE — Progress Notes (Signed)
Anne from [facility] and confirmed not to give Vancomycin dose this morning and pharmacist would adjust when they came in today.

## 2016-10-21 NOTE — Progress Notes (Signed)
PROGRESS NOTE    [name]  QIH:[id number] DOB: 12-09-1950 DOA: 10/18/2016 PCP: Sharilyn Sites, MD     Brief Narrative:  66 year old man admitted to the hospital on 7/8 as a transfer from an outside hospital due to a left lower extremity cellulitis.   Assessment & Plan:   Principal Problem:   Cellulitis of left lower extremity Active Problems:   Diabetes mellitus   Sleep apnea   Left lower extremity cellulitis -According to records present since 7/3 without significant improvement on 2 different cephalosporins and vancomycin. -Patient shows me pictures from a week ago and it is most certainly improved at this time. Most notably, his thigh is no longer erythematous and it was quite so on presentation. -Outside hospital records indicate left lower extremity was negative for DVT. -Plan to continue vancomycin and Zosyn today, CT without evidence of complicating features, hopefully plan to narrow antibiotics over the next 24-48 hours.  Type 2 diabetes -Fair control, continue current management  Obstructive sleep apnea -Continue nightly C Pap  Morbid obesity -Counseled on diet and exercise   DVT prophylaxis: Subcutaneous Lovenox  Code Status: Full code  Family Communication: Patient only  Disposition Plan: [facility] for discharge home in 48-72 hours  Consultants:  None  Procedures:  None  Antimicrobials:  Anti-infectives    Start     Dose/Rate Route Frequency Ordered Stop   10/21/16 1000  vancomycin (VANCOCIN) 2,000 mg in sodium chloride 0.9 % 500 mL IVPB     2,000 mg 250 mL/hr over 120 Minutes Intravenous Every 24 hours 10/21/16 0803     10/19/16 1600  vancomycin (VANCOCIN) 1,250 mg in sodium chloride 0.9 % 250 mL IVPB  Status:  Discontinued     1,250 mg 166.7 mL/hr over 90 Minutes Intravenous Every 12 hours 10/19/16 1111 10/21/16 0803   10/19/16 1000  vancomycin (VANCOCIN) 1,250 mg in sodium chloride 0.9 % 250 mL IVPB  Status:  Discontinued     1,250 mg 166.7 mL/hr  over 90 Minutes Intravenous Every 12 hours 10/18/16 1954 10/19/16 1111   10/18/16 2300  vancomycin (VANCOCIN) IVPB 1000 mg/200 mL premix     1,000 mg 200 mL/hr over 60 Minutes Intravenous  Once 10/18/16 1952 10/19/16 0000   10/18/16 2100  vancomycin (VANCOCIN) 1,500 mg in sodium chloride 0.9 % 500 mL IVPB     1,500 mg 250 mL/hr over 120 Minutes Intravenous  Once 10/18/16 1952 10/19/16 0039   10/18/16 2100  piperacillin-tazobactam (ZOSYN) IVPB 3.375 g     3.375 g 12.5 mL/hr over 240 Minutes Intravenous Every 8 hours 10/18/16 1952         Subjective: Still complains of pain and difficulty ambulating due to left lower extremity edema.   Objective: Vitals:   10/20/16 1320 10/20/16 2100 10/21/16 0603 10/21/16 1323  BP: (!) 153/74 (!) 143/68 (!) 152/74 123/60  Pulse: 90 90 84 82  Resp: 18 18 18 16   Temp: (!) 97.5 F (36.4 C) 98.6 F (37 C) 97.9 F (36.6 C) 98.2 F (36.8 C)  TempSrc: Oral Oral Oral Oral  SpO2: 99% 97% 96% 95%  Weight:      Height:        Intake/Output Summary (Last 24 hours) at 10/21/16 1719 Last data filed at 10/21/16 1600  Gross per 24 hour  Intake             2360 ml  Output             3350 ml  Net             -  990 ml   Filed Weights   10/18/16 1600  Weight: (!) 171.3 kg (377 lb 9.6 oz)    Examination:  General exam: Alert, awake, oriented x 3Common morbidly obese  Respiratory system: Clear to auscultation. Respiratory effort normal. Cardiovascular system:RRR. No murmurs, rubs, gallops. Gastrointestinal system: Abdomen is nondistended, soft and nontender.  Normal bowel sounds heard. Central nervous system: Alert and oriented. No focal neurological deficits. Extremities:Changes of venous insufficiency over bilateral anterior lower extremities, redness of the left lower extremity has significantly decreased from prior margins and is less intense as compared to initial pictures.  Psychiatry: Judgement and insight appear normal. Mood & affect  appropriate.     Data Reviewed: I have personally reviewed following labs and imaging studies  CBC:  Recent Labs Lab 10/18/16 1642 10/20/16 0610  WBC 13.2* 12.8*  NEUTROABS 10.0*  --   HGB 13.6 11.8*  HCT 41.0 37.1*  MCV 89.7 90.0  PLT 233 496   Basic Metabolic Panel:  Recent Labs Lab 10/18/16 1642 10/19/16 0711 10/21/16 0336  NA 142 138 137  K 4.1 4.6 4.5  CL 105 99* 100*  CO2 28 30 28   GLUCOSE 177* 176* 130*  BUN 17 14 14   CREATININE 1.11 1.11 1.16  CALCIUM 8.7* 8.4* 9.0   GFR: Estimated Creatinine Clearance: 95.8 mL/min (by C-G formula based on SCr of 1.16 mg/dL). Liver Function Tests:  Recent Labs Lab 10/18/16 1642  AST 25  ALT 22  ALKPHOS 76  BILITOT 0.7  PROT 7.2  ALBUMIN 2.5*   No results for input(s): LIPASE, AMYLASE in the last 168 hours. No results for input(s): AMMONIA in the last 168 hours. Coagulation Profile: No results for input(s): INR, PROTIME in the last 168 hours. Cardiac Enzymes: No results for input(s): CKTOTAL, CKMB, CKMBINDEX, TROPONINI in the last 168 hours. BNP (last 3 results) No results for input(s): PROBNP in the last 8760 hours. HbA1C: No results for input(s): HGBA1C in the last 72 hours. CBG:  Recent Labs Lab 10/20/16 1704 10/20/16 2140 10/21/16 0732 10/21/16 1059 10/21/16 1619  GLUCAP 134* 181* 124* 240* 140*   Lipid Profile: No results for input(s): CHOL, HDL, LDLCALC, TRIG, CHOLHDL, LDLDIRECT in the last 72 hours. Thyroid Function Tests: No results for input(s): TSH, T4TOTAL, FREET4, T3FREE, THYROIDAB in the last 72 hours. Anemia Panel: No results for input(s): VITAMINB12, FOLATE, FERRITIN, TIBC, IRON, RETICCTPCT in the last 72 hours. Urine analysis: No results found for: COLORURINE, APPEARANCEUR, LABSPEC, PHURINE, GLUCOSEU, HGBUR, BILIRUBINUR, KETONESUR, PROTEINUR, UROBILINOGEN, NITRITE, LEUKOCYTESUR Sepsis Labs: @LABRCNTIP (procalcitonin:4,lacticidven:4)  )No results found for this or any previous visit  (from the past 240 hour(s)).       Radiology Studies: No results found.      Scheduled Meds: . aspirin EC  81 mg Oral Daily  . enoxaparin (LOVENOX) injection  80 mg Subcutaneous Q24H  . glipiZIDE  10 mg Oral BID  . insulin aspart  0-20 Units Subcutaneous TID WC  . insulin aspart  0-5 Units Subcutaneous QHS  . lisinopril  5 mg Oral Daily  . pioglitazone  30 mg Oral Daily  . simvastatin  5 mg Oral QPM   Continuous Infusions: . piperacillin-tazobactam (ZOSYN)  IV Stopped (10/21/16 1753)  . vancomycin Stopped (10/21/16 1148)     LOS: 3 days    Time spent: 30 minutes. Greater than 50% of this time was spent in direct contact with the patient coordinating care.     Lelon Frohlich, MD [facility] Pager [phone]  If 7PM-7AM,  please contact night-coverage [email or web address] Password [facility] 10/21/2016, 5:19 PM

## 2016-10-21 NOTE — Care Management Note (Signed)
Case Management Note  Patient Details  Name: [name] MRN: [id number] Date of Birth: [birth year]  Subjective/Objective:                  Admitted with cellulitis. Pt is from home, lives wit his wife, is ind at baseline. Pt wears CPAP HS. He is now unable to ambulate due to his cellulitis. He has a walker to use if needed. He says his wife works during the day and he will be alone during that time. He is concerned about being able to ambulate prior to DC. He would be able to complete any wound care himself if needed.  Action/Plan: Pt planning to return home with self care at DC. No needs anticipated, CM will follow to DC.  Expected Discharge Date:     10/23/2016             Expected Discharge Plan:  Home/Self Care  In-House Referral:  NA  Discharge planning Services  CM Consult  Post Acute Care Choice:  NA Choice offered to:  NA  Status of Service:  Completed, signed off  Sherald Barge, RN 10/21/2016, 11:12 AM

## 2016-10-21 NOTE — Progress Notes (Signed)
Pharmacy Antibiotic Note  [name] is a 66 y.o. male admitted on 10/18/2016 with cellulitis.  Pharmacy has been consulted for Vancomycin and Zosyn dosing. Vanc trough this am 20 mcg/ml  Plan: Change Vancomycin to 2gm IV q24 hours Zosyn 3.375 GM IV every 8 hours   Height: 5\' 8"  (172.7 cm) Weight: (!) 377 lb 9.6 oz (171.3 kg) IBW/kg (Calculated) : 68.4  Temp (24hrs), Avg:98 F (36.7 C), Min:97.5 F (36.4 C), Max:98.6 F (37 C)   Recent Labs Lab 10/18/16 1642 10/19/16 0711 10/20/16 0610 10/21/16 0336  WBC 13.2*  --  12.8*  --   CREATININE 1.11 1.11  --  1.16  VANCOTROUGH  --   --   --  20    Estimated Creatinine Clearance: 95.8 mL/min (by C-G formula based on SCr of 1.16 mg/dL).    No Known Allergies  Antimicrobials this admission: Vancomycin  7/8 >>  Zosyn 7/8 >>   Dose adjustments this admission 7/11  Vanc changed from 1250 q12 to 2gm IV q24 hours  Thank you for allowing pharmacy to be a part of this patient's care.  Excell Seltzer Poteet 10/21/2016 8:07 AM

## 2016-10-21 NOTE — Progress Notes (Signed)
Late entry from 2000: Pt states his last BM was around a week or two ago (10-14-16). Pt states that he doesn't feel bad from not going to bathroom but it is unusual for him. RN offered to page MD to order pt medication like Miralax to help pt with constipation. Pt refused at this time. RN educated pt on importance of having regular bowel movements d/t complications that can arise. RN also educated pt on narcotic use slowing down GI system. Pt verbalized understanding and stated he would talk to his physician in the am. Will continue to monitor pt

## 2016-10-21 NOTE — Progress Notes (Signed)
RN spoke with Garlan Fillers, Pharmacist at [facility] about Vancomycin Trough of 20. RN instructed by Webb Silversmith to hold Vancomycin dose at the moment until she get confirmation to give dose. Webb Silversmith stated, usually is Vanc trough is 20 or greater dose is held, but order states to give unless >20. RN also Lorelee New that lab drew Vanc trough at 0345, dose was due at 0400 so dose would be late anyway. RN waiting to hear back from pharmacist.

## 2016-10-21 NOTE — Progress Notes (Signed)
Patient using home CPAP machine independently. ?

## 2016-10-22 LAB — GLUCOSE, CAPILLARY
GLUCOSE-CAPILLARY: 141 mg/dL — AB (ref 65–99)
GLUCOSE-CAPILLARY: 161 mg/dL — AB (ref 65–99)
GLUCOSE-CAPILLARY: 182 mg/dL — AB (ref 65–99)
Glucose-Capillary: 220 mg/dL — ABNORMAL HIGH (ref 65–99)

## 2016-10-22 NOTE — Progress Notes (Signed)
PROGRESS NOTE    [name]  XMI:[id number] DOB: November 03, 1950 DOA: 10/18/2016 PCP: Sharilyn Sites, MD     Brief Narrative:  66 year old man admitted to the hospital on 7/8 as a transfer from an outside hospital due to a left lower extremity cellulitis.   Assessment & Plan:   Principal Problem:   Cellulitis of left lower extremity Active Problems:   Diabetes mellitus   Sleep apnea   Left lower extremity cellulitis -According to records present since 7/3 without significant improvement on 2 different cephalosporins and vancomycin. -Patient shows me pictures from a week ago and it is most certainly improved at this time. Most notably, his thigh is no longer erythematous and it was quite so on presentation. -Outside hospital records indicate left lower extremity was negative for DVT. -Plan to continue vancomycin and Zosyn for another 24 hours, CT without evidence of complicating features, hopefully plan to narrow antibiotics over the next 24-48 hours.  Type 2 diabetes -Fair control, continue current management  Obstructive sleep apnea -Continue nightly C Pap  Morbid obesity -Counseled on diet and exercise   DVT prophylaxis: Subcutaneous Lovenox  Code Status: Full code  Family Communication: Patient only  Disposition Plan: [facility] for discharge home in 48-72 hours  Consultants:  None  Procedures:  None  Antimicrobials:  Anti-infectives    Start     Dose/Rate Route Frequency Ordered Stop   10/21/16 1000  vancomycin (VANCOCIN) 2,000 mg in sodium chloride 0.9 % 500 mL IVPB     2,000 mg 250 mL/hr over 120 Minutes Intravenous Every 24 hours 10/21/16 0803     10/19/16 1600  vancomycin (VANCOCIN) 1,250 mg in sodium chloride 0.9 % 250 mL IVPB  Status:  Discontinued     1,250 mg 166.7 mL/hr over 90 Minutes Intravenous Every 12 hours 10/19/16 1111 10/21/16 0803   10/19/16 1000  vancomycin (VANCOCIN) 1,250 mg in sodium chloride 0.9 % 250 mL IVPB  Status:  Discontinued     1,250  mg 166.7 mL/hr over 90 Minutes Intravenous Every 12 hours 10/18/16 1954 10/19/16 1111   10/18/16 2300  vancomycin (VANCOCIN) IVPB 1000 mg/200 mL premix     1,000 mg 200 mL/hr over 60 Minutes Intravenous  Once 10/18/16 1952 10/19/16 0000   10/18/16 2100  vancomycin (VANCOCIN) 1,500 mg in sodium chloride 0.9 % 500 mL IVPB     1,500 mg 250 mL/hr over 120 Minutes Intravenous  Once 10/18/16 1952 10/19/16 0039   10/18/16 2100  piperacillin-tazobactam (ZOSYN) IVPB 3.375 g     3.375 g 12.5 mL/hr over 240 Minutes Intravenous Every 8 hours 10/18/16 1952         Subjective: Still complains of pain and difficulty ambulating due to left lower extremity edema. Very nervous about going home because his wife works and he would be alone during the day time.  Objective: Vitals:   10/21/16 1958 10/21/16 2130 10/22/16 0554 10/22/16 1252  BP:  138/68 (!) 128/44 133/80  Pulse:  86  86  Resp:  20 17 16   Temp:  (!) 97.4 F (36.3 C) 98.6 F (37 C) 97.8 F (36.6 C)  TempSrc:  Oral Oral Oral  SpO2: 97% 98% 98% 97%  Weight:      Height:        Intake/Output Summary (Last 24 hours) at 10/22/16 1519 Last data filed at 10/22/16 0600  Gross per 24 hour  Intake              940 ml  Output  1500 ml  Net             -560 ml   Filed Weights   10/18/16 1600  Weight: (!) 171.3 kg (377 lb 9.6 oz)    Examination:  General exam: Alert, awake, oriented x 3Common morbidly obese  Respiratory system: Clear to auscultation. Respiratory effort normal. Cardiovascular system:RRR. No murmurs, rubs, gallops. Gastrointestinal system: Abdomen is nondistended, soft and nontender.  Normal bowel sounds heard. Central nervous system: Alert and oriented. No focal neurological deficits. Extremities:       Psychiatry: Judgement and insight appear normal. Mood & affect appropriate.     Data Reviewed: I have personally reviewed following labs and imaging studies  CBC:  Recent Labs Lab  10/18/16 1642 10/20/16 0610  WBC 13.2* 12.8*  NEUTROABS 10.0*  --   HGB 13.6 11.8*  HCT 41.0 37.1*  MCV 89.7 90.0  PLT 233 253   Basic Metabolic Panel:  Recent Labs Lab 10/18/16 1642 10/19/16 0711 10/21/16 0336  NA 142 138 137  K 4.1 4.6 4.5  CL 105 99* 100*  CO2 28 30 28   GLUCOSE 177* 176* 130*  BUN 17 14 14   CREATININE 1.11 1.11 1.16  CALCIUM 8.7* 8.4* 9.0   GFR: Estimated Creatinine Clearance: 95.8 mL/min (by C-G formula based on SCr of 1.16 mg/dL). Liver Function Tests:  Recent Labs Lab 10/18/16 1642  AST 25  ALT 22  ALKPHOS 76  BILITOT 0.7  PROT 7.2  ALBUMIN 2.5*   No results for input(s): LIPASE, AMYLASE in the last 168 hours. No results for input(s): AMMONIA in the last 168 hours. Coagulation Profile: No results for input(s): INR, PROTIME in the last 168 hours. Cardiac Enzymes: No results for input(s): CKTOTAL, CKMB, CKMBINDEX, TROPONINI in the last 168 hours. BNP (last 3 results) No results for input(s): PROBNP in the last 8760 hours. HbA1C: No results for input(s): HGBA1C in the last 72 hours. CBG:  Recent Labs Lab 10/21/16 1059 10/21/16 1619 10/21/16 2127 10/22/16 0754 10/22/16 1123  GLUCAP 240* 140* 242* 141* 220*   Lipid Profile: No results for input(s): CHOL, HDL, LDLCALC, TRIG, CHOLHDL, LDLDIRECT in the last 72 hours. Thyroid Function Tests: No results for input(s): TSH, T4TOTAL, FREET4, T3FREE, THYROIDAB in the last 72 hours. Anemia Panel: No results for input(s): VITAMINB12, FOLATE, FERRITIN, TIBC, IRON, RETICCTPCT in the last 72 hours. Urine analysis: No results found for: COLORURINE, APPEARANCEUR, LABSPEC, PHURINE, GLUCOSEU, HGBUR, BILIRUBINUR, KETONESUR, PROTEINUR, UROBILINOGEN, NITRITE, LEUKOCYTESUR Sepsis Labs: @LABRCNTIP (procalcitonin:4,lacticidven:4)  )No results found for this or any previous visit (from the past 240 hour(s)).       Radiology Studies: No results found.      Scheduled Meds: . aspirin EC  81  mg Oral Daily  . enoxaparin (LOVENOX) injection  80 mg Subcutaneous Q24H  . glipiZIDE  10 mg Oral BID  . insulin aspart  0-20 Units Subcutaneous TID WC  . insulin aspart  0-5 Units Subcutaneous QHS  . lisinopril  5 mg Oral Daily  . pioglitazone  30 mg Oral Daily  . simvastatin  5 mg Oral QPM   Continuous Infusions: . piperacillin-tazobactam (ZOSYN)  IV Stopped (10/22/16 1000)  . vancomycin 2,000 mg (10/22/16 1118)     LOS: 4 days    Time spent: 30 minutes. Greater than 50% of this time was spent in direct contact with the patient coordinating care.     Lelon Frohlich, MD [facility] Pager [phone]  If 7PM-7AM, please contact night-coverage [email or web address] Password [facility] 10/22/2016,  3:19 PM

## 2016-10-23 LAB — GLUCOSE, CAPILLARY
GLUCOSE-CAPILLARY: 142 mg/dL — AB (ref 65–99)
GLUCOSE-CAPILLARY: 143 mg/dL — AB (ref 65–99)
Glucose-Capillary: 204 mg/dL — ABNORMAL HIGH (ref 65–99)
Glucose-Capillary: 241 mg/dL — ABNORMAL HIGH (ref 65–99)

## 2016-10-23 LAB — BASIC METABOLIC PANEL
Anion gap: 8 (ref 5–15)
BUN: 15 mg/dL (ref 6–20)
CALCIUM: 9.2 mg/dL (ref 8.9–10.3)
CO2: 29 mmol/L (ref 22–32)
CREATININE: 1.17 mg/dL (ref 0.61–1.24)
Chloride: 100 mmol/L — ABNORMAL LOW (ref 101–111)
GFR calc Af Amer: >60 mL/min (ref >60)
GLUCOSE: 158 mg/dL — AB (ref 65–99)
Potassium: 4.5 mmol/L (ref 3.5–5.1)
Sodium: 137 mmol/L (ref 135–145)

## 2016-10-23 LAB — CBC
HEMATOCRIT: 40.1 % (ref 39.0–52.0)
Hemoglobin: 13 g/dL (ref 13.0–17.0)
MCH: 29.1 pg (ref 26.0–34.0)
MCHC: 32.4 g/dL (ref 30.0–36.0)
MCV: 89.9 fL (ref 78.0–100.0)
PLATELETS: 210 10*3/uL (ref 150–400)
RBC: 4.46 MIL/uL (ref 4.22–5.81)
RDW: 13.9 % (ref 11.5–15.5)
WBC: 11.6 10*3/uL — ABNORMAL HIGH (ref 4.0–10.5)

## 2016-10-23 MED ORDER — DOXYCYCLINE HYCLATE 100 MG PO TABS
100.0000 mg | ORAL_TABLET | Freq: Two times a day (BID) | ORAL | Status: DC
  Administered 2016-10-23 – 2016-10-24 (×3): 100 mg via ORAL
  Filled 2016-10-23 (×3): qty 1

## 2016-10-23 NOTE — Evaluation (Signed)
Physical Therapy Evaluation Patient Details Name: [name] MRN: [id number] DOB: [birth year] Today's Date: 10/23/2016   History of Present Illness  [name] is a 66yo M who comes to [facility] on 7/8, transfered from acute care in [place], [place] d/t cellulitis of the LLE. PMH: DM2, peripheral neuropathy. At baseline, pt amb limited community distances with SPC, independnent in self care.   Clinical Impression  Pt admitted with above diagnosis. Pt currently with functional limitations due to the deficits listed below (see "PT Problem List"). Upon entry, the patient is received semirecumbent in bed, awake and agreeable to participate. Pt is mildly anxious about the state of his leg, which remains painful and poorly tolerant to positions other than elevation. The pt is alert and oriented x3, pleasant, conversational, and following simple and multi-step commands consistently, although is at times impulsive due to pain/anxiety. Functional mobility assessment demonstrates moderate weakness and general deconditioning, the pt now requiring physical assist for most bed mobility, and +2 assist c RW and elevated surface to perform sitting to standing, which he is never fully able to do. During attempted transfers, his large habitus and inability to weight bear on LLE due to pain create a large load burden to the RLE and BUE, which he is unable to manage. In current state, simple care such as dressing and toiletting will require assistance, and he would essentially be confined to the bed if DC to home, elevating risk of PNA, DVT, and additional deconditioning. The patient is not agreeable to PT rationale of his deconditioned state, but attributing to all these limitations to continued state of infection in LLE. He expresses poor insight into how he will perform basic ADL/self care if he were DC today. Additional education is provided on the importance of avoiding self-imposed bed-rest, and avoiding any additional  deconditioning. Pt will benefit from skilled PT intervention to increase independence and safety with basic mobility in preparation for discharge to the venue listed below.       Follow Up Recommendations SNF    Equipment Recommendations  None recommended by PT    Recommendations for Other Services       Precautions / Restrictions Precautions Precautions: Fall Precaution Comments: *elevate LLE when supine/seated.       Mobility  Bed Mobility Overal bed mobility: Needs Assistance Bed Mobility: Supine to Sit;Sit to Supine     Supine to sit: Min assist Sit to supine: Modified independent (Device/Increase time)   General bed mobility comments: also requires MinA for scooting forward at EOB; impulsive and refuses teaching for safe Left lateral scooting at EOB.  Transfers Overall transfer level: Needs assistance Equipment used: Rolling walker (2 wheeled) Transfers: Sit to/from Stand Sit to Stand: From elevated surface;+2 physical assistance;Mod assist (due to patient's weight, PT cannot provide "maxA" by definition )         General transfer comment: three attempts made, pt selectively NWB on LLE d/t pain, pain able to finally get Rt knee locked, but unable to fully load RLE and extend trunk. RLE unstable, and BUE insufficienty weak.   Ambulation/Gait Ambulation/Gait assistance:  (unable at this time due to gross weakness, deconditioning, large habitus. )              Stairs            Wheelchair Mobility    Modified Rankin (Stroke Patients Only)       Balance Overall balance assessment: Needs assistance   Sitting balance-Leahy Scale: Good  Standing balance support: During functional activity;Bilateral upper extremity supported Standing balance-Leahy Scale: Zero                               Pertinent Vitals/Pain Pain Assessment: 0-10 Pain Score: 5  Pain Location: distal LLE  Pain Intervention(s): Limited activity within patient's  tolerance;Monitored during session;Repositioned    Home Living Family/patient expects to be discharged to:: Private residence Living Arrangements: Spouse/significant other Available Help at Discharge: Family Type of Home: House Home Access: Stairs to enter     Home Layout: Two level;Able to live on main level with bedroom/bathroom Home Equipment: Walker - 4 wheels;Cane - single point;Electric scooter      Prior Function Level of Independence: Independent with assistive device(s)         Comments: limited community distances with SPC      Hand Dominance        Extremity/Trunk Assessment        Lower Extremity Assessment Lower Extremity Assessment: LLE deficits/detail LLE Deficits / Details: edematous, erythmatous, and limited ROM due to pain; Redness when elevated, which moved to purple when at EOB.  LLE Sensation: history of peripheral neuropathy       Communication   Communication: No difficulties  Cognition Arousal/Alertness: Awake/alert Behavior During Therapy: WFL for tasks assessed/performed Overall Cognitive Status: Difficult to assess                                 General Comments: mild denial of current limitations, poor insight into self care ablities in future       General Comments      Exercises     Assessment/Plan    PT Assessment Patient needs continued PT services  PT Problem List Decreased strength;Decreased range of motion;Decreased activity tolerance;Decreased balance;Decreased mobility;Obesity;Pain       PT Treatment Interventions DME instruction;Gait training;Stair training;Functional mobility training;Therapeutic activities;Therapeutic exercise;Balance training;Patient/family education    PT Goals (Current goals can be found in the Care Plan section)  Acute Rehab PT Goals Patient Stated Goal: Resolve pain and swelling in LLE PT Goal Formulation: With patient Time For Goal Achievement: 11/06/16 Potential to  Achieve Goals: Fair    Frequency Min 2X/week   Barriers to discharge Inaccessible home environment;Decreased caregiver support      Co-evaluation               AM-PAC PT "6 Clicks" Daily Activity  Outcome Measure Difficulty turning over in bed (including adjusting bedclothes, sheets and blankets)?: A Lot Difficulty moving from lying on back to sitting on the side of the bed? : Total Difficulty sitting down on and standing up from a chair with arms (e.g., wheelchair, bedside commode, etc,.)?: Total Help needed moving to and from a bed to chair (including a wheelchair)?: Total Help needed walking in hospital room?: Total Help needed climbing 3-5 steps with a railing? : Total 6 Click Score: 7    End of Session Equipment Utilized During Treatment: Gait belt Activity Tolerance: Patient limited by fatigue;Patient limited by pain;Treatment limited secondary to medical complications (Comment) (edema and erythema worseing with seated position) Patient left: in bed;with call bell/phone within reach;with bed alarm set Nurse Communication: Mobility status;Need for lift equipment (encouraged RN to utilize chair positioning in bed with LLE elevated. Pt likely to not tolerate being in chair until pain and swelling are improved. ) PT Visit  Diagnosis: Muscle weakness (generalized) (M62.81);Pain Pain - Right/Left: Left Pain - part of body: Leg;Ankle and joints of foot    Time: 0634-9494 PT Time Calculation (min) (ACUTE ONLY): 27 min   Charges:   PT Evaluation $PT Eval Low Complexity: 1 Procedure PT Treatments $Therapeutic Activity: 8-22 mins   PT G Codes:        12:18 PM, 10-27-16 Etta Grandchild, PT, DPT Physical Therapist - [place] [phone] (Office)   Brionne Mertz C 10-27-16, 12:10 PM

## 2016-10-23 NOTE — Progress Notes (Signed)
PROGRESS NOTE    [name]  JME:[id number] DOB: [birth year] DOA: 10/18/2016 PCP: Sharilyn Sites, MD     Brief Narrative:  66 year old man admitted to the hospital on 7/8 as a transfer from an outside hospital due to a left lower extremity cellulitis.   Assessment & Plan:   Principal Problem:   Cellulitis of left lower extremity Active Problems:   Diabetes mellitus   Sleep apnea   Left lower extremity cellulitis -According to records present since 7/3 without significant improvement on 2 different cephalosporins and vancomycin. -Patient shows me pictures from a week ago and it is most certainly improved at this time. Most notably, his thigh is no longer erythematous and it was quite so on presentation. -Outside hospital records indicate left lower extremity was negative for DVT. -He also had a CT scan that was negative for abscess. -Plan to transition to doxycycline today, observe another 24 hours in the hospital and potentially discharge in 24-48 hours. -Patient has been seen today by physical therapy with recommendations for SNF. Social work is aware.  Type 2 diabetes -Fair control, continue current management  Obstructive sleep apnea -Continue nightly C Pap  Morbid obesity -Counseled on diet and exercise   DVT prophylaxis: Subcutaneous Lovenox  Code Status: Full code  Family Communication: Patient only  Disposition Plan: Hope for discharge home vs SNF in 24-48 hours   Consultants:  None   Procedures:  None   Antimicrobials:  Anti-infectives    Start     Dose/Rate Route Frequency Ordered Stop   10/23/16 1245  doxycycline (VIBRA-TABS) tablet 100 mg     100 mg Oral Every 12 hours 10/23/16 1234     10/21/16 1000  vancomycin (VANCOCIN) 2,000 mg in sodium chloride 0.9 % 500 mL IVPB  Status:  Discontinued     2,000 mg 250 mL/hr over 120 Minutes Intravenous Every 24 hours 10/21/16 0803 10/23/16 1234   10/19/16 1600  vancomycin (VANCOCIN) 1,250 mg in sodium  chloride 0.9 % 250 mL IVPB  Status:  Discontinued     1,250 mg 166.7 mL/hr over 90 Minutes Intravenous Every 12 hours 10/19/16 1111 10/21/16 0803   10/19/16 1000  vancomycin (VANCOCIN) 1,250 mg in sodium chloride 0.9 % 250 mL IVPB  Status:  Discontinued     1,250 mg 166.7 mL/hr over 90 Minutes Intravenous Every 12 hours 10/18/16 1954 10/19/16 1111   10/18/16 2300  vancomycin (VANCOCIN) IVPB 1000 mg/200 mL premix     1,000 mg 200 mL/hr over 60 Minutes Intravenous  Once 10/18/16 1952 10/19/16 0000   10/18/16 2100  vancomycin (VANCOCIN) 1,500 mg in sodium chloride 0.9 % 500 mL IVPB     1,500 mg 250 mL/hr over 120 Minutes Intravenous  Once 10/18/16 1952 10/19/16 0039   10/18/16 2100  piperacillin-tazobactam (ZOSYN) IVPB 3.375 g  Status:  Discontinued     3.375 g 12.5 mL/hr over 240 Minutes Intravenous Every 8 hours 10/18/16 1952 10/23/16 1234       Subjective: Still complains of pain and difficulty ambulating due to left lower extremity edema. Very nervous about going home because his wife works and he would be alone during the day time. Having a hard time deciding if he would like to go to SNF.  Objective: Vitals:   10/22/16 2006 10/22/16 2114 10/23/16 0546 10/23/16 0745  BP:  (!) 148/57 (!) 132/42   Pulse:  85 86   Resp:  20 20   Temp:  98.3 F (36.8 C) 97.7 F (36.5  C)   TempSrc:  Oral Oral   SpO2: 93% 97% 99% 95%  Weight:      Height:        Intake/Output Summary (Last 24 hours) at 10/23/16 1626 Last data filed at 10/23/16 0546  Gross per 24 hour  Intake                0 ml  Output             1450 ml  Net            -1450 ml   Filed Weights   10/18/16 1600  Weight: (!) 171.3 kg (377 lb 9.6 oz)    Examination:   General exam: Alert, awake, oriented x 3, morbidly obese Respiratory system: Clear to auscultation. Respiratory effort normal. Cardiovascular system:RRR. No murmurs, rubs, gallops. Gastrointestinal system: Abdomen is nondistended, soft and nontender. No  organomegaly or masses felt. Normal bowel sounds heard. Central nervous system: Alert and oriented. No focal neurological deficits. Extremities:        Psychiatry: Judgement and insight appear normal. Mood & affect appropriate.                Data Reviewed: I have personally reviewed following labs and imaging studies  CBC:  Recent Labs Lab 10/18/16 1642 10/20/16 0610 10/23/16 0718  WBC 13.2* 12.8* 11.6*  NEUTROABS 10.0*  --   --   HGB 13.6 11.8* 13.0  HCT 41.0 37.1* 40.1  MCV 89.7 90.0 89.9  PLT 233 232 638   Basic Metabolic Panel:  Recent Labs Lab 10/18/16 1642 10/19/16 0711 10/21/16 0336 10/23/16 0718  NA 142 138 137 137  K 4.1 4.6 4.5 4.5  CL 105 99* 100* 100*  CO2 28 30 28 29   GLUCOSE 177* 176* 130* 158*  BUN 17 14 14 15   CREATININE 1.11 1.11 1.16 1.17  CALCIUM 8.7* 8.4* 9.0 9.2   GFR: Estimated Creatinine Clearance: 95 mL/min (by C-G formula based on SCr of 1.17 mg/dL). Liver Function Tests:  Recent Labs Lab 10/18/16 1642  AST 25  ALT 22  ALKPHOS 76  BILITOT 0.7  PROT 7.2  ALBUMIN 2.5*   No results for input(s): LIPASE, AMYLASE in the last 168 hours. No results for input(s): AMMONIA in the last 168 hours. Coagulation Profile: No results for input(s): INR, PROTIME in the last 168 hours. Cardiac Enzymes: No results for input(s): CKTOTAL, CKMB, CKMBINDEX, TROPONINI in the last 168 hours. BNP (last 3 results) No results for input(s): PROBNP in the last 8760 hours. HbA1C: No results for input(s): HGBA1C in the last 72 hours. CBG:  Recent Labs Lab 10/22/16 1617 10/22/16 2117 10/23/16 0746 10/23/16 1112 10/23/16 1612  GLUCAP 161* 182* 142* 204* 241*   Lipid Profile: No results for input(s): CHOL, HDL, LDLCALC, TRIG, CHOLHDL, LDLDIRECT in the last 72 hours. Thyroid Function Tests: No results for input(s): TSH, T4TOTAL, FREET4, T3FREE, THYROIDAB in the last 72 hours. Anemia Panel: No results for input(s): VITAMINB12,  FOLATE, FERRITIN, TIBC, IRON, RETICCTPCT in the last 72 hours. Urine analysis: No results found for: COLORURINE, APPEARANCEUR, LABSPEC, PHURINE, GLUCOSEU, HGBUR, BILIRUBINUR, KETONESUR, PROTEINUR, UROBILINOGEN, NITRITE, LEUKOCYTESUR Sepsis Labs: @LABRCNTIP (procalcitonin:4,lacticidven:4)  )No results found for this or any previous visit (from the past 240 hour(s)).       Radiology Studies: No results found.      Scheduled Meds: . aspirin EC  81 mg Oral Daily  . doxycycline  100 mg Oral Q12H  . enoxaparin (LOVENOX) injection  80  mg Subcutaneous Q24H  . glipiZIDE  10 mg Oral BID  . insulin aspart  0-20 Units Subcutaneous TID WC  . insulin aspart  0-5 Units Subcutaneous QHS  . lisinopril  5 mg Oral Daily  . pioglitazone  30 mg Oral Daily  . simvastatin  5 mg Oral QPM   Continuous Infusions:    LOS: 5 days    Time spent: 25 minutes. Greater than 50% of this time was spent in direct contact with the patient coordinating care.     Lelon Frohlich, MD [facility] Pager [phone]  If 7PM-7AM, please contact night-coverage [email or web address] Password [facility] 10/23/2016, 4:26 PM

## 2016-10-23 NOTE — Progress Notes (Signed)
Pt requested a new CPAP hose and mask. Provided pt with the requested equipment and set up CPAP for nighttime use. CPAP is plugged into a red outlet.

## 2016-10-23 NOTE — Progress Notes (Signed)
Patient ambulated with walker from bed to chair, [employer] had to hold walker, to prevent it from rolling for patient to stand. Patient had dinner while sitting up in his chair.  Patient ambulated from chair to bed, without notifying staff. Patient is currently lying in bed.

## 2016-10-23 NOTE — Therapy (Signed)
PT returned to room as promised to deliver a printed exercise program for bed level strengthening. Pt expresses motivation to perform independently to improve strength and mobility while admitted.  3:28 PM, 10/23/16 Etta Grandchild, PT, DPT Physical Therapist - [place] [phone] (Office)

## 2016-10-23 NOTE — Care Management Important Message (Signed)
Important Message  Patient Details  Name: [name] MRN: [id number] Date of Birth: 08/22/1950   Medicare Important Message Given:  Yes    Sherald Barge, RN 10/23/2016, 10:50 AM

## 2016-10-23 NOTE — Clinical Social Work Note (Signed)
Clinical Social Work Assessment  Patient Details  Name: [name] MRN: [id number] Date of Birth: 25-Dec-1950  Date of referral:  10/23/16               Reason for consult:  Discharge Planning                Permission sought to share information with:    Permission granted to share information::     Name::        Agency::     Relationship::     Contact Information:     Housing/Transportation Living arrangements for the past 2 months:  Single Family Home Source of Information:  Patient Patient Interpreter Needed:  None Criminal Activity/Legal Involvement Pertinent to Current Situation/Hospitalization:  No - Comment as needed Significant Relationships:  Spouse Lives with:  Spouse Do you feel safe going back to the place where you live?  No (Patient states he is unsure as to if he would be safe at home.) Need for family participation in patient care:  Yes (Comment)  Care giving concerns:  None identified at baseline.    Social Worker assessment / plan:  Patient lives with wife. At baseline he drives, ambulates with a cane (sometimes) and is independent in ADLs.  LCSW discussed SNF recommendation and provided list. Patient stated that he did not want to make a decision about SNF prior to speaking with his wife. LCSW educated patient that even if his clinical information was faxed out he could still make the the decision to go home with services in place. Patient would not provide facilities of interest stating that he did not feel that discharge today should be an option and that he would speak with his wife about it and let LCSW know.   Employment status:  Retired Nurse, adult PT Recommendations:  [place] / Referral to community resources:  [place]  Patient/Family's Response to care:  Patient is not agreeable to SNF at this time. He wants to speak with his wife about it before making a  decision.   Patient/Family's Understanding of and Emotional Response to Diagnosis, Current Treatment, and Prognosis:  Patient understands his diagnosis, treatment and prognosis.   Emotional Assessment Appearance:  Appears stated age Attitude/Demeanor/Rapport:  Angry Affect (typically observed):  Frustrated Orientation:  Oriented to Self, Oriented to Place, Oriented to  Time, Oriented to Situation Alcohol / Substance use:  Not Applicable Psych involvement (Current and /or in the community):  No (Comment)  Discharge Needs  Concerns to be addressed:  Discharge Planning Concerns Readmission within the last 30 days:  No Current discharge risk:  None Barriers to Discharge:  No Barriers Identified   Ihor Gully, LCSW 10/23/2016, 1:55 PM

## 2016-10-24 LAB — CBC
HEMATOCRIT: 40.5 % (ref 39.0–52.0)
HEMOGLOBIN: 13.3 g/dL (ref 13.0–17.0)
MCH: 29.3 pg (ref 26.0–34.0)
MCHC: 32.8 g/dL (ref 30.0–36.0)
MCV: 89.2 fL (ref 78.0–100.0)
Platelets: 226 10*3/uL (ref 150–400)
RBC: 4.54 MIL/uL (ref 4.22–5.81)
RDW: 13.9 % (ref 11.5–15.5)
WBC: 12.6 10*3/uL — AB (ref 4.0–10.5)

## 2016-10-24 LAB — GLUCOSE, CAPILLARY
GLUCOSE-CAPILLARY: 147 mg/dL — AB (ref 65–99)
Glucose-Capillary: 287 mg/dL — ABNORMAL HIGH (ref 65–99)

## 2016-10-24 MED ORDER — OXYCODONE HCL 5 MG PO TABS: 5.0000 mg | ORAL_TABLET | ORAL | 0 refills | Status: DC | PRN

## 2016-10-24 MED ORDER — DOXYCYCLINE HYCLATE 100 MG PO TABS: 100.0000 mg | ORAL_TABLET | Freq: Two times a day (BID) | ORAL | 0 refills | Status: DC

## 2016-10-24 NOTE — Discharge Instructions (Signed)

## 2016-10-24 NOTE — Discharge Summary (Addendum)
Physician Discharge Summary  [name] EQA:[id number] DOB: 1950/10/27 DOA: 10/18/2016  PCP: Sharilyn Sites, MD  Admit date: 10/18/2016 Discharge date: 10/24/2016  Time spent: 45 minutes  Recommendations for Outpatient Follow-up:  -Will be discharged home today. -Advised to follow-up with primary care provider in one week to assess evolution of cellulitis. -To continue doxycycline for an extra 14 days.   Discharge Diagnoses:  Principal Problem:   Cellulitis of left lower extremity Active Problems:   Diabetes mellitus   Sleep apnea   Discharge Condition: Stable and improved  Filed Weights   10/18/16 1600  Weight: (!) 171.3 kg (377 lb 9.6 oz)    History of present illness:  As per Dr. Olevia Bowens on 7/8:  [name] is a 66 y.o. male with medical history significant of type 2 diabetes, hypercholesterolemia, peripheral neuropathy, sleep apnea who is coming from [facility], where he was admitted for the past 4 days for continuation of treatment left lower extremity cellulitis. WBC has been progressively decreasing, but the patient states that the edema and tenderness have not improved much. He denies trauma to the area, fever or chills. He denies sore throat, productive cough, chest pain, palpitations, dizziness, diaphoresis, PND, orthopnea, abdominal pain, nausea, emesis, diarrhea, constipation, melena or hematochezia. He denies dysuria, frequency or hematuria.   Hospital Course:   Left lower extremity cellulitis -According to records present since 7/3 without significant improvement on 2 different cephalosporins and vancomycin. -Patient shows me pictures from a week ago and it is most certainly improved at this time. Most notably, his thigh is no longer erythematous and it was quite so on presentation. First pic below is from presentation at [facility] on 7/8 and last pic is on day of DC at [facility] on 7/14:        -Outside hospital records indicate left lower extremity was  negative for DVT. -He also had a CT scan that was negative for abscess. -He completed 8 days of vanc/zosyn and has been transitioned to doxycycline that he will need to continued for an additional 14 days. -Patient has been seen  by physical therapy with recommendations for SNF. Patient is adamant about not going to SNF but he also says he is not ready to go home. I have explained to him that this point he is not systemically toxic, his cellulitis has improved, he has been on IV antibiotics for a total of 10 days and that acute hospitalization is no longer necessary, however he still requires treatment with oral antibiotics for his cellulitis. He was very nervous about discharging home and says that he is so weak however he refuses our assistance in placing him in a skilled nursing facility that has been recommended by physical therapy. Home health services will be arranged.  Type 2 diabetes -Fair control, continue current management  Obstructive sleep apnea -Continue nightly C Pap  Morbid obesity -Counseled on diet and exercise  Procedures:  None   Consultations:  None  Discharge Instructions  Discharge Instructions    Diet - low sodium heart healthy    Complete by:  As directed    Increase activity slowly    Complete by:  As directed      Allergies as of 10/24/2016   No Known Allergies     Medication List    STOP taking these medications   naproxen sodium 220 MG tablet Commonly known as:  ANAPROX     TAKE these medications   aspirin EC 81 MG tablet Take  81 mg by mouth daily.   doxycycline 100 MG tablet Commonly known as:  VIBRA-TABS Take 1 tablet (100 mg total) by mouth every 12 (twelve) hours.   glipiZIDE 10 MG 24 hr tablet Commonly known as:  GLUCOTROL XL Take 10 mg by mouth 2 (two) times daily.   JOINTFLEX 3.1 % Crea Generic drug:  Camphor Apply 1 application topically daily as needed (for pain).   lisinopril 5 MG tablet Commonly known as:   PRINIVIL,ZESTRIL Take 5 mg by mouth daily.   metFORMIN 1000 MG tablet Commonly known as:  GLUCOPHAGE Take 1,000 mg by mouth 2 (two) times daily with a meal.   oxyCODONE 5 MG immediate release tablet Commonly known as:  Oxy IR/ROXICODONE Take 1 tablet (5 mg total) by mouth every 3 (three) hours as needed for moderate pain or breakthrough pain.   pioglitazone 30 MG tablet Commonly known as:  ACTOS Take 30 mg by mouth daily.   simvastatin 10 MG tablet Commonly known as:  ZOCOR Take 5 mg by mouth every evening.      No Known Allergies Follow-up Information    Sharilyn Sites, MD. Schedule an appointment as soon as possible for a visit in 1 week(s).   Specialty:  Family Medicine Contact information: [address] [place] [address] [phone]            The results of significant diagnostics from this hospitalization (including imaging, microbiology, ancillary and laboratory) are listed below for reference.    Significant Diagnostic Studies: Ct Tibia Fibula Left W Contrast  Result Date: 10/19/2016 CLINICAL DATA:  Left lower leg redness, pain and swelling for 1 week. EXAM: CT OF THE LOWER LEFT EXTREMITY WITH CONTRAST TECHNIQUE: Multidetector CT imaging of the lower left extremity was performed according to the standard protocol following intravenous contrast administration. COMPARISON:  None. CONTRAST:  176mL ISOVUE-300 IOPAMIDOL (ISOVUE-300) INJECTION 61% FINDINGS: Bones/Joint/Cartilage There is no acute bony or joint abnormality. No bony destructive change is seen. The patient has a remote healed distal fibular fracture with 2 fixation screws in place. There is severe degenerative change about the knee appearing worst in the medial compartment where there is bone-on-bone joint space narrowing. Moderate tibiotalar osteoarthritis is also identified. Ligaments Suboptimally assessed by CT. Muscles and Tendons Fat planes within muscle are preserved. No intramuscular fluid  collection. No tear is identified. Moderate fatty atrophy of lower leg musculature is most notable in the gastrocnemius and soleus. Soft tissues Infiltration of subcutaneous fat is worst along the medial aspect of the lower leg and progressive distally. Cutaneous tissues appear thickened. Scattered calcifications are identified in subcutaneous fatty tissues along the medial aspect of the lower leg are likely due remote infectious or inflammatory process. IMPRESSION: Findings most compatible with left lower leg cellulitis without myositis CT abscess or evidence of osteomyelitis. Severe degenerative change about the left knee. Moderate tibiotalar osteoarthritis is also identified. Electronically Signed   By: Inge Rise M.D.   On: 10/19/2016 15:54    Microbiology: No results found for this or any previous visit (from the past 240 hour(s)).   Labs: Basic Metabolic Panel:  Recent Labs Lab 10/18/16 1642 10/19/16 0711 10/21/16 0336 10/23/16 0718  NA 142 138 137 137  K 4.1 4.6 4.5 4.5  CL 105 99* 100* 100*  CO2 28 30 28 29   GLUCOSE 177* 176* 130* 158*  BUN 17 14 14 15   CREATININE 1.11 1.11 1.16 1.17  CALCIUM 8.7* 8.4* 9.0 9.2   Liver Function Tests:  Recent Labs Lab 10/18/16 1642  AST 25  ALT 22  ALKPHOS 76  BILITOT 0.7  PROT 7.2  ALBUMIN 2.5*   No results for input(s): LIPASE, AMYLASE in the last 168 hours. No results for input(s): AMMONIA in the last 168 hours. CBC:  Recent Labs Lab 10/18/16 1642 10/20/16 0610 10/23/16 0718 10/24/16 0623  WBC 13.2* 12.8* 11.6* 12.6*  NEUTROABS 10.0*  --   --   --   HGB 13.6 11.8* 13.0 13.3  HCT 41.0 37.1* 40.1 40.5  MCV 89.7 90.0 89.9 89.2  PLT 233 232 210 226   Cardiac Enzymes: No results for input(s): CKTOTAL, CKMB, CKMBINDEX, TROPONINI in the last 168 hours. BNP: BNP (last 3 results) No results for input(s): BNP in the last 8760 hours.  ProBNP (last 3 results) No results for input(s): PROBNP in the last 8760  hours.  CBG:  Recent Labs Lab 10/23/16 1112 10/23/16 1612 10/23/16 2329 10/24/16 0729 10/24/16 1129  GLUCAP 204* 241* 143* 147* 287*       Signed:  HERNANDEZ ACOSTA,Tremayne Sheldon  [facility] Pager: [phone] 10/24/2016, 11:58 AM

## 2016-10-24 NOTE — Progress Notes (Signed)
Pt discharged home today per Dr. Hernandez. Pt's IV site D/C'd and WDL. Pt's VSS. Pt provided with home medication list, discharge instructions and prescriptions. Verbalized understanding. Pt left floor via WC in stable condition accompanied by NT. 

## 2016-11-26 ENCOUNTER — Encounter (HOSPITAL_COMMUNITY): Payer: Self-pay | Admitting: Emergency Medicine

## 2016-11-26 ENCOUNTER — Emergency Department (HOSPITAL_COMMUNITY)
Admission: EM | Admit: 2016-11-26 | Discharge: 2016-11-26 | Disposition: A | Discharge status: Home / Self Care | Payer: Medicare Other | Attending: Emergency Medicine | Admitting: Emergency Medicine

## 2016-11-26 DIAGNOSIS — Z7982 Long term (current) use of aspirin: Secondary | ICD-10-CM | POA: Insufficient documentation

## 2016-11-26 DIAGNOSIS — R110 Nausea: Secondary | ICD-10-CM | POA: Diagnosis present

## 2016-11-26 DIAGNOSIS — Z79899 Other long term (current) drug therapy: Secondary | ICD-10-CM | POA: Diagnosis not present

## 2016-11-26 DIAGNOSIS — T887XXA Unspecified adverse effect of drug or medicament, initial encounter: Secondary | ICD-10-CM | POA: Insufficient documentation

## 2016-11-26 DIAGNOSIS — Z7984 Long term (current) use of oral hypoglycemic drugs: Secondary | ICD-10-CM | POA: Insufficient documentation

## 2016-11-26 DIAGNOSIS — Z87891 Personal history of nicotine dependence: Secondary | ICD-10-CM | POA: Insufficient documentation

## 2016-11-26 DIAGNOSIS — E119 Type 2 diabetes mellitus without complications: Secondary | ICD-10-CM | POA: Insufficient documentation

## 2016-11-26 DIAGNOSIS — Y658 Other specified misadventures during surgical and medical care: Secondary | ICD-10-CM | POA: Diagnosis not present

## 2016-11-26 NOTE — ED Provider Notes (Signed)
[place] DEPT Provider Note   CSN: [id number] Arrival date & time: 11/26/16  0235  Time seen 3:32 AM   History   Chief Complaint Chief Complaint  Patient presents with  . Allergic Reaction    HPI [name] is a 66 y.o. male.  HPI  Patient reports he had cellulitis in his left lower extremity and was admitted to the hospital for 2 weeks. He was then discharged and was on oral doxycycline for about 2 weeks. He states he finished it about 2 weeks ago. He was seen by his PCP on August 15 because he was starting to have a little more redness of the leg. He was started on Septra DS He took 2 pills at 1 PM which is the instructions on the bottle he states about 6 PM after coming inside from being outside he had some itching just above his ankles. That has resolved. At 10 PM he took 2 more of the Septra DS and about 2 AM he woke up feeling "terrible". He states he had nausea and dry heaving and he felt disoriented. He denies any more itching or rash at that point. He states however now he feels fine.  PCP Sharilyn Sites, MD   Past Medical History:  Diagnosis Date  . Diabetes mellitus   . Hypercholesteremia   . Peripheral neuropathy   . Sleep apnea     Patient Active Problem List   Diagnosis Date Noted  . Cellulitis of left lower extremity 10/18/2016  . Diabetes mellitus 10/18/2016  . Hypercholesteremia 10/18/2016  . Sleep apnea 10/18/2016    Past Surgical History:  Procedure Laterality Date  . COLONOSCOPY N/A 02/26/2016   Procedure: COLONOSCOPY;  Surgeon: Daneil Dolin, MD;  Location: [facility] ENDO SUITE;  Service: Endoscopy;  Laterality: N/A;  8:30 Am  . HIP SURGERY Right   . LEG SURGERY Left        Home Medications    Prior to Admission medications   Medication Sig Start Date End Date Taking? Authorizing Provider  aspirin EC 81 MG tablet Take 81 mg by mouth daily.    [provider]  Camphor (JOINTFLEX) 3.1 % CREA Apply 1 application topically daily as  needed (for pain).    [provider]  doxycycline (VIBRA-TABS) 100 MG tablet Take 1 tablet (100 mg total) by mouth every 12 (twelve) hours. 10/24/16   Isaac Bliss, Rayford Halsted, MD  glipiZIDE (GLUCOTROL XL) 10 MG 24 hr tablet Take 10 mg by mouth 2 (two) times daily. 07/20/14   [provider]  lisinopril (PRINIVIL,ZESTRIL) 5 MG tablet Take 5 mg by mouth daily. 06/20/14   [provider]  metFORMIN (GLUCOPHAGE) 1000 MG tablet Take 1,000 mg by mouth 2 (two) times daily with a meal.    [provider]  oxyCODONE (OXY IR/ROXICODONE) 5 MG immediate release tablet Take 1 tablet (5 mg total) by mouth every 3 (three) hours as needed for moderate pain or breakthrough pain. 10/24/16   Isaac Bliss, Rayford Halsted, MD  pioglitazone (ACTOS) 30 MG tablet Take 30 mg by mouth daily.    [provider]  simvastatin (ZOCOR) 10 MG tablet Take 5 mg by mouth every evening.  06/20/14   [provider]    Family History Family History  Problem Relation Age of Onset  . Lung cancer Mother   . Hiatal hernia Mother   . Heart Problems Father        Pacemaker  . CAD Father   .  Benign prostatic hyperplasia Father     Social History Social History  Substance Use Topics  . Smoking status: Former Smoker    Types: Cigarettes  . Smokeless tobacco: Never Used  . Alcohol use No     Allergies   Sulfamethoxazole-trimethoprim   Review of Systems Review of Systems  All other systems reviewed and are negative.    Physical Exam Updated Vital Signs BP 115/62   Pulse 85   Temp 98.7 F (37.1 C) (Oral)   Resp 20   Wt (!) 171 kg (377 lb)   SpO2 99%   BMI 57.32 kg/m   Physical Exam  Constitutional: He is oriented to person, place, and time. He appears well-developed and well-nourished.  obese  HENT:  Head: Normocephalic and atraumatic.  Right Ear: External ear normal.  Left Ear: External ear normal.  Nose: Nose normal.  Eyes: Conjunctivae and EOM are normal.   Neck: Normal range of motion.  Cardiovascular: Normal rate.   Pulmonary/Chest: Effort normal. No respiratory distress.  Musculoskeletal:  Patient is noted to have some redness of his left lower extremity however he shows me pictures of his recent infection and the redness and swelling is much improved. The right lower leg is much less involved.  Neurological: He is alert and oriented to person, place, and time. No cranial nerve deficit.  Skin: Skin is warm and dry. There is erythema.  Psychiatric: He has a normal mood and affect. His behavior is normal.  Nursing note and vitals reviewed.    ED Treatments / Results  Labs (all labs ordered are listed, but only abnormal results are displayed) Labs Reviewed - No data to display  EKG  EKG Interpretation None       Radiology No results found.  Procedures Procedures (including critical care time)  Medications Ordered in ED Medications - No data to display   Initial Impression / Assessment and Plan / ED Course  I have reviewed the triage vital signs and the nursing notes.  Pertinent labs & imaging results that were available during my care of the patient were reviewed by me and considered in my medical decision making (see chart for details).     I am not sure why patient was placed on to Septra DS twice a day unless it was due to his weight although I have not seen it done before. Patient states he is going to go to the office at 7 AM to see Dr. Hilma Favors.He does not want me to write him another prescription for doxycycline or a different antibiotic such as clindamycin. He states he would prefer to see Dr. Hilma Favors. Patient currently feels fine and back to his baseline state he was discharged.   Final Clinical Impressions(s) / ED Diagnoses   Final diagnoses:  Side effect of medication    Plan discharge  Rolland Porter, MD, Barbette Or, MD 11/26/16 [phone]

## 2016-11-26 NOTE — ED Triage Notes (Signed)
Pt states he has new RX for sulfamethoxazole 11/25/16-he took (2) 11/25/16 @ 1300 and (2) 11/25/16 @2200 -woke up today @ 0200 itching, n/v, flush, "felt like something was crawling all over me", RX was given for cellulitis on lower legs

## 2016-11-26 NOTE — Discharge Instructions (Signed)
You can follow up with Dr Hilma Favors as you said you wanted to do this morning. Do not take any more of the new antibiotic, Septra DS until you see him.

## 2017-05-04 ENCOUNTER — Other Ambulatory Visit (HOSPITAL_COMMUNITY): Payer: Self-pay | Admitting: Physician Assistant

## 2017-05-04 DIAGNOSIS — R1032 Left lower quadrant pain: Secondary | ICD-10-CM

## 2017-05-18 ENCOUNTER — Ambulatory Visit (HOSPITAL_COMMUNITY)
Admission: RE | Admit: 2017-05-18 | Discharge: 2017-05-18 | Disposition: A | Discharge status: Home / Self Care | Payer: Medicare Other | Source: Ambulatory Visit | Attending: Physician Assistant | Admitting: Physician Assistant

## 2017-05-18 ENCOUNTER — Ambulatory Visit (HOSPITAL_COMMUNITY): Payer: Medicare Other

## 2017-05-18 DIAGNOSIS — D3501 Benign neoplasm of right adrenal gland: Secondary | ICD-10-CM | POA: Insufficient documentation

## 2017-05-18 DIAGNOSIS — K760 Fatty (change of) liver, not elsewhere classified: Secondary | ICD-10-CM | POA: Diagnosis not present

## 2017-05-18 DIAGNOSIS — R1031 Right lower quadrant pain: Secondary | ICD-10-CM | POA: Insufficient documentation

## 2017-05-18 DIAGNOSIS — R1032 Left lower quadrant pain: Secondary | ICD-10-CM | POA: Insufficient documentation

## 2017-05-18 DIAGNOSIS — R1033 Periumbilical pain: Secondary | ICD-10-CM | POA: Insufficient documentation

## 2017-05-18 DIAGNOSIS — I700 Atherosclerosis of aorta: Secondary | ICD-10-CM | POA: Insufficient documentation

## 2017-05-18 DIAGNOSIS — I2510 Atherosclerotic heart disease of native coronary artery without angina pectoris: Secondary | ICD-10-CM | POA: Diagnosis not present

## 2017-05-18 LAB — POCT I-STAT CREATININE: Creatinine, Ser: 1 mg/dL (ref 0.61–1.24)

## 2017-05-18 MED ORDER — IOPAMIDOL (ISOVUE-300) INJECTION 61%
125.0000 mL | Freq: Once | INTRAVENOUS | Status: AC | PRN
  Administered 2017-05-18: 125 mL via INTRAVENOUS

## 2017-06-08 ENCOUNTER — Ambulatory Visit (HOSPITAL_COMMUNITY)
Admission: RE | Admit: 2017-06-08 | Discharge: 2017-06-08 | Disposition: A | Discharge status: Home / Self Care | Payer: Medicare Other | Source: Ambulatory Visit | Attending: Physician Assistant | Admitting: Physician Assistant

## 2017-06-08 ENCOUNTER — Other Ambulatory Visit (HOSPITAL_COMMUNITY): Payer: Self-pay | Admitting: Physician Assistant

## 2017-06-08 DIAGNOSIS — R0602 Shortness of breath: Secondary | ICD-10-CM

## 2017-06-08 DIAGNOSIS — J40 Bronchitis, not specified as acute or chronic: Secondary | ICD-10-CM | POA: Diagnosis present

## 2017-06-29 ENCOUNTER — Emergency Department (HOSPITAL_COMMUNITY)
Admission: EM | Admit: 2017-06-29 | Discharge: 2017-06-30 | Disposition: A | Discharge status: Home / Self Care | Payer: Medicare Other | Attending: Emergency Medicine | Admitting: Emergency Medicine

## 2017-06-29 ENCOUNTER — Emergency Department (HOSPITAL_COMMUNITY): Payer: Medicare Other

## 2017-06-29 ENCOUNTER — Other Ambulatory Visit: Payer: Self-pay

## 2017-06-29 ENCOUNTER — Encounter (HOSPITAL_COMMUNITY): Payer: Self-pay | Admitting: Emergency Medicine

## 2017-06-29 DIAGNOSIS — Z87891 Personal history of nicotine dependence: Secondary | ICD-10-CM | POA: Insufficient documentation

## 2017-06-29 DIAGNOSIS — Z7984 Long term (current) use of oral hypoglycemic drugs: Secondary | ICD-10-CM | POA: Insufficient documentation

## 2017-06-29 DIAGNOSIS — E1140 Type 2 diabetes mellitus with diabetic neuropathy, unspecified: Secondary | ICD-10-CM | POA: Diagnosis not present

## 2017-06-29 DIAGNOSIS — J40 Bronchitis, not specified as acute or chronic: Secondary | ICD-10-CM | POA: Diagnosis not present

## 2017-06-29 DIAGNOSIS — Z7982 Long term (current) use of aspirin: Secondary | ICD-10-CM | POA: Insufficient documentation

## 2017-06-29 DIAGNOSIS — Z79899 Other long term (current) drug therapy: Secondary | ICD-10-CM | POA: Diagnosis not present

## 2017-06-29 DIAGNOSIS — R0602 Shortness of breath: Secondary | ICD-10-CM | POA: Diagnosis present

## 2017-06-29 LAB — COMPREHENSIVE METABOLIC PANEL
ALBUMIN: 3.7 g/dL (ref 3.5–5.0)
ALT: 29 U/L (ref 17–63)
AST: 23 U/L (ref 15–41)
Alkaline Phosphatase: 99 U/L (ref 38–126)
Anion gap: 12 (ref 5–15)
BUN: 22 mg/dL — AB (ref 6–20)
CO2: 24 mmol/L (ref 22–32)
CREATININE: 1.21 mg/dL (ref 0.61–1.24)
Calcium: 9.5 mg/dL (ref 8.9–10.3)
Chloride: 100 mmol/L — ABNORMAL LOW (ref 101–111)
GFR calc non Af Amer: >60 mL/min (ref >60)
GLUCOSE: 226 mg/dL — AB (ref 65–99)
Potassium: 4.8 mmol/L (ref 3.5–5.1)
SODIUM: 136 mmol/L (ref 135–145)
Total Bilirubin: 1 mg/dL (ref 0.3–1.2)
Total Protein: 7.7 g/dL (ref 6.5–8.1)

## 2017-06-29 LAB — CBC
HCT: 46.8 % (ref 39.0–52.0)
Hemoglobin: 14.9 g/dL (ref 13.0–17.0)
MCH: 28.7 pg (ref 26.0–34.0)
MCHC: 31.8 g/dL (ref 30.0–36.0)
MCV: 90 fL (ref 78.0–100.0)
PLATELETS: 160 10*3/uL (ref 150–400)
RBC: 5.2 MIL/uL (ref 4.22–5.81)
RDW: 14.4 % (ref 11.5–15.5)
WBC: 9.8 10*3/uL (ref 4.0–10.5)

## 2017-06-29 LAB — BRAIN NATRIURETIC PEPTIDE: B Natriuretic Peptide: 180 pg/mL — ABNORMAL HIGH (ref 0.0–100.0)

## 2017-06-29 MED ORDER — ALBUTEROL (5 MG/ML) CONTINUOUS INHALATION SOLN
15.0000 mg/h | INHALATION_SOLUTION | Freq: Once | RESPIRATORY_TRACT | Status: AC
  Administered 2017-06-29: 15 mg/h via RESPIRATORY_TRACT
  Filled 2017-06-29: qty 20

## 2017-06-29 MED ORDER — METHYLPREDNISOLONE SODIUM SUCC 125 MG IJ SOLR
125.0000 mg | Freq: Once | INTRAMUSCULAR | Status: AC
  Administered 2017-06-29: 125 mg via INTRAVENOUS
  Filled 2017-06-29: qty 2

## 2017-06-29 MED ORDER — MAGNESIUM SULFATE 2 GM/50ML IV SOLN
2.0000 g | Freq: Once | INTRAVENOUS | Status: AC
  Administered 2017-06-29: 2 g via INTRAVENOUS
  Filled 2017-06-29: qty 50

## 2017-06-29 NOTE — ED Provider Notes (Signed)
[facility] EMERGENCY DEPARTMENT Provider Note   CSN: [id number] Arrival date & time: 06/29/17  1712     History   Chief Complaint Chief Complaint  Patient presents with  . Shortness of Breath    HPI [name] is a 67 y.o. male.  He presents for evaluation of persistent "bronchitis, for 2 months" despite multiple treatments including IM Depo-Medrol x2, oral antibiotics, and prednisone taper.  Last completed prednisone, about 3 days ago.  Antibiotics were about 1 week ago.  He is currently using a home nebulizer, with albuterol, about 4 times each day.  He is not currently a smoker.  He does not have a pulmonologist.  There are, chills, nausea, vomiting, weakness or dizziness.  He has a cough productive of yellow sputum.  He denies chest pain.  There are no other known modifying factors.  HPI  Past Medical History:  Diagnosis Date  . Diabetes mellitus   . Hypercholesteremia   . Peripheral neuropathy   . Sleep apnea     Patient Active Problem List   Diagnosis Date Noted  . Cellulitis of left lower extremity 10/18/2016  . Diabetes mellitus 10/18/2016  . Hypercholesteremia 10/18/2016  . Sleep apnea 10/18/2016    Past Surgical History:  Procedure Laterality Date  . COLONOSCOPY N/A 02/26/2016   Procedure: COLONOSCOPY;  Surgeon: Daneil Dolin, MD;  Location: [facility] ENDO SUITE;  Service: Endoscopy;  Laterality: N/A;  8:30 Am  . HIP SURGERY Right   . LEG SURGERY Left        Home Medications    Prior to Admission medications   Medication Sig Start Date End Date Taking? Authorizing Provider  aspirin EC 81 MG tablet Take 81 mg by mouth daily.    [provider]  Camphor (JOINTFLEX) 3.1 % CREA Apply 1 application topically daily as needed (for pain).    [provider]  doxycycline (VIBRA-TABS) 100 MG tablet Take 1 tablet (100 mg total) by mouth every 12 (twelve) hours. 10/24/16   Isaac Bliss, Rayford Halsted, MD  glipiZIDE (GLUCOTROL XL) 10 MG 24 hr  tablet Take 10 mg by mouth 2 (two) times daily. 07/20/14   [provider]  lisinopril (PRINIVIL,ZESTRIL) 5 MG tablet Take 5 mg by mouth daily. 06/20/14   [provider]  metFORMIN (GLUCOPHAGE) 1000 MG tablet Take 1,000 mg by mouth 2 (two) times daily with a meal.    [provider]  oxyCODONE (OXY IR/ROXICODONE) 5 MG immediate release tablet Take 1 tablet (5 mg total) by mouth every 3 (three) hours as needed for moderate pain or breakthrough pain. 10/24/16   Isaac Bliss, Rayford Halsted, MD  pioglitazone (ACTOS) 30 MG tablet Take 30 mg by mouth daily.    [provider]  simvastatin (ZOCOR) 10 MG tablet Take 5 mg by mouth every evening.  06/20/14   [provider]  sulfamethoxazole-trimethoprim (BACTRIM DS,SEPTRA DS) 800-160 MG tablet Take 2 tablets by mouth 2 (two) times daily.    [provider]    Family History Family History  Problem Relation Age of Onset  . Lung cancer Mother   . Hiatal hernia Mother   . Heart Problems Father        Pacemaker  . CAD Father   . Benign prostatic hyperplasia Father     Social History Social History   Tobacco Use  . Smoking status: Former Smoker    Types: Cigarettes  . Smokeless tobacco: Never Used  Substance Use Topics  . Alcohol  use: No  . Drug use: No     Allergies   Sulfamethoxazole-trimethoprim   Review of Systems Review of Systems  All other systems reviewed and are negative.    Physical Exam Updated Vital Signs BP (!) 138/53   Pulse 95   Temp 98.7 F (37.1 C) (Oral)   Resp 16   Ht 5\' 8"  (1.727 m)   Wt (!) 163.3 kg (360 lb)   SpO2 100%   BMI 54.74 kg/m   Physical Exam  Constitutional: He is oriented to person, place, and time. He appears well-developed. He does not appear ill.  Morbidly obese  HENT:  Head: Normocephalic and atraumatic.  Right Ear: External ear normal.  Left Ear: External ear normal.  Eyes: Conjunctivae and EOM are normal. Pupils are equal, round, and  reactive to light.  Neck: Normal range of motion and phonation normal. Neck supple.  Cardiovascular: Normal rate, regular rhythm and normal heart sounds.  Pulmonary/Chest: Effort normal. He has decreased breath sounds in the right middle field, the right lower field, the left middle field and the left lower field. He has wheezes in the right upper field, the right middle field, the right lower field, the left upper field, the left middle field and the left lower field. He has rhonchi (Scattered). He has no rales. He exhibits no bony tenderness.  Abdominal: Soft. There is no tenderness.  Musculoskeletal: Normal range of motion.       Right lower leg: Normal.       Left lower leg: Normal.  Neurological: He is alert and oriented to person, place, and time. No cranial nerve deficit or sensory deficit. He exhibits normal muscle tone. Coordination normal.  Skin: Skin is warm, dry and intact.  Psychiatric: He has a normal mood and affect. His behavior is normal. Judgment and thought content normal.  Nursing note and vitals reviewed.    ED Treatments / Results  Labs (all labs ordered are listed, but only abnormal results are displayed) Labs Reviewed  COMPREHENSIVE METABOLIC PANEL - Abnormal; Notable for the following components:      Result Value   Chloride 100 (*)    Glucose, Bld 226 (*)    BUN 22 (*)    All other components within normal limits  BRAIN NATRIURETIC PEPTIDE - Abnormal; Notable for the following components:   B Natriuretic Peptide 180.0 (*)    All other components within normal limits  CBC    EKG  EKG Interpretation  Date/Time:  Tuesday June 29 2017 17:20:47 EDT Ventricular Rate:  124 PR Interval:  228 QRS Duration: 70 QT Interval:  302 QTC Calculation: 433 R Axis:   63 Text Interpretation:  Sinus tachycardia with 1st degree A-V block with frequent Premature ventricular complexes Low voltage QRS Cannot rule out Anterior infarct , age undetermined Abnormal ECG Since  last tracing PR interval is longer Confirmed by Daleen Bo [id number]) on 06/29/2017 8:32:20 PM       Radiology Dg Chest 2 View  Result Date: 06/29/2017 CLINICAL DATA:  Worsening cough. Shortness of breath. History of diabetes and smoking. EXAM: CHEST - 2 VIEW COMPARISON:  06/08/2017 FINDINGS: The cardiomediastinal silhouette is unchanged and within normal limits. Mild peribronchial thickening and chronic interstitial coarsening are similar to the prior study. No acute airspace consolidation, overt pulmonary edema, pleural effusion, pneumothorax is identified. No acute osseous abnormality is seen. IMPRESSION: Chronic bronchitic changes. Electronically Signed   By: Logan Bores M.D.   On: 06/29/2017 17:52  Procedures Procedures (including critical care time)  Medications Ordered in ED Medications  magnesium sulfate IVPB 2 g 50 mL (not administered)  methylPREDNISolone sodium succinate (SOLU-MEDROL) 125 mg/2 mL injection 125 mg (125 mg Intravenous Given 06/29/17 2052)  albuterol (PROVENTIL,VENTOLIN) solution continuous neb (15 mg/hr Nebulization Given 06/29/17 2133)     Initial Impression / Assessment and Plan / ED Course  I have reviewed the triage vital signs and the nursing notes.  Pertinent labs & imaging results that were available during my care of the patient were reviewed by me and considered in my medical decision making (see chart for details).  Clinical Course as of Jun 29 2229  Tue Jun 29, 2017  2045 Initial evaluation consistent with bronchitis, which is persistent, despite multiple outpatient treatments.  We will give dose of Solu-Medrol, nebulizer, and reassess.  [EW]  2228 Consistent with bronchitis DG Chest 2 View [EW]  2229 Screening labs to evaluate shortness of breath, with mild BNP elevation 180, normal complete metabolic panel except glucose high 226 and BUN high 22.  CBC is normal.  [EW]    Clinical Course User Index [EW] Daleen Bo, MD     Patient Vitals  for the past 24 hrs:  BP Temp Temp src Pulse Resp SpO2 Height Weight  06/29/17 2200 (!) 138/53 - - 95 16 100 % - -  06/29/17 2133 - - - - - 97 % - -  06/29/17 2130 (!) 138/56 - - (!) 47 (!) 25 96 % - -  06/29/17 2052 127/84 - - 90 19 97 % - -  06/29/17 1945 (!) 143/51 98.7 F (37.1 C) Oral 95 18 95 % - -  06/29/17 1718 125/84 98.3 F (36.8 C) Oral 96 (!) 22 97 % - -  06/29/17 1717 - - - - - - 5\' 8"  (1.727 m) (!) 163.3 kg (360 lb)  06/29/17 1714 - - Oral - - - - -    10:05 PM Reevaluation with update and discussion. After initial assessment and treatment, an updated evaluation reveals he is currently finishing his continuous nebulizer and appears more comfortable lying supine, without respiratory distress.  He requests additional medicine through the IV.  IV magnesium ordered. Daleen Bo      Final Clinical Impressions(s) / ED Diagnoses   Final diagnoses:  Bronchitis   Recurrent bronchitis, status post multiple treatments by PCP.  Patient does not have a pulmonologist who is managing his care.  Doubt acute congestive heart failure, pneumonia, metabolic instability or impending vascular collapse.  Nursing Notes Reviewed/ Care Coordinated Applicable Imaging Reviewed Interpretation of Laboratory Data incorporated into ED treatment   Plan-reevaluation for discharge after completion of magnesium bolus and continuous nebulizer treatment, by oncoming provider team  ED Discharge Orders    None       Daleen Bo, MD 07/01/17 1016

## 2017-06-29 NOTE — ED Triage Notes (Signed)
Pt c/o of sob x 2 months.  Pt states having treat by his PCP but not relief.

## 2017-06-29 NOTE — ED Notes (Signed)
Have paged respiratory  

## 2017-06-30 MED ORDER — ALBUTEROL SULFATE HFA 108 (90 BASE) MCG/ACT IN AERS: 2.0000 | INHALATION_SPRAY | RESPIRATORY_TRACT | 3 refills | Status: DC | PRN

## 2017-06-30 MED ORDER — PREDNISONE 20 MG PO TABS: 40.0000 mg | ORAL_TABLET | Freq: Every day | ORAL | 0 refills | Status: DC

## 2017-06-30 MED ORDER — PROMETHAZINE-DM 6.25-15 MG/5ML PO SYRP: 5.0000 mL | ORAL_SOLUTION | Freq: Four times a day (QID) | ORAL | 0 refills | Status: DC | PRN

## 2017-06-30 NOTE — ED Provider Notes (Signed)
Shift, the patient's care was reassigned to me.  The patient has been able to ambulate with oxygen between 93 and 94%.  Wheezing is minimal, speaking in full sentences, feels much more comfortable and is amenable to discharge.  Will send home with prednisone, promethazine next month or fan and an albuterol inhaler.  Patient expressed understanding.  Stable for discharge at this time.  No signs of pneumonia on x-ray.  Patient was informed of his results and treatment plan   Noemi Chapel, MD 06/30/17 [phone]

## 2017-06-30 NOTE — ED Notes (Signed)
Pt ambulated to nurses station maintain 94-93% but mostly 94%

## 2017-06-30 NOTE — Discharge Instructions (Signed)
Your testing today shows that there are no signs of pneumonia.  Please use the albuterol inhaler 2 puffs every 4 hours as needed for wheezing coughing or shortness of breath  Prednisone 40 mg a day for 5 days  Promethazine dextromethorphan, 5 mL every 6 hours as needed for coughing  Seek medical attention for severe or worsening symptoms including increasing chest pain shortness of breath coughing fever or weakness.

## 2017-08-23 ENCOUNTER — Ambulatory Visit: Payer: Medicare Other | Admitting: Cardiology

## 2017-08-23 ENCOUNTER — Encounter: Payer: Self-pay | Admitting: Cardiology

## 2017-08-23 VITALS — BP 136/70 | HR 91 | Ht 68.0 in | Wt 353.2 lb

## 2017-08-23 DIAGNOSIS — R0789 Other chest pain: Secondary | ICD-10-CM | POA: Diagnosis not present

## 2017-08-23 DIAGNOSIS — I5189 Other ill-defined heart diseases: Secondary | ICD-10-CM

## 2017-08-23 DIAGNOSIS — G4733 Obstructive sleep apnea (adult) (pediatric): Secondary | ICD-10-CM

## 2017-08-23 NOTE — Progress Notes (Signed)
Clinical Summary Mr. Sadowski is a 67 y.o.male seen as new consult, referred by Dr Luan Pulling for chest pain.   1. Chest pain - 05/2017 CT A/P with aortic atherosclerosis - reports episode after starting a new inhaler. Night after taking for first time, pain in chest does not remember chacarter, about 5-6/10. Had some dizziness and went to ER - pain lasted at least 1 hour. Not positional -no prior, no recurrence.  - chronic SOB/DOE unchanged, limited by chronic leg pains.  - can have some LE edema ati times.    2. OSA - told about right sided heart weakness from the [place] he reports. He reports he was told his heart function improved with using cpap.   Past Medical History:  Diagnosis Date  . Diabetes mellitus   . Hypercholesteremia   . Peripheral neuropathy   . Sleep apnea      Allergies  Allergen Reactions  . Sulfamethoxazole-Trimethoprim Nausea And Vomiting     Current Outpatient Medications  Medication Sig Dispense Refill  . albuterol (PROVENTIL HFA;VENTOLIN HFA) 108 (90 Base) MCG/ACT inhaler Inhale 2 puffs into the lungs every 4 (four) hours as needed for wheezing or shortness of breath. 1 Inhaler 3  . aspirin EC 81 MG tablet Take 81 mg by mouth daily.    . Camphor (JOINTFLEX) 3.1 % CREA Apply 1 application topically daily as needed (for pain).    Marland Kitchen doxycycline (VIBRA-TABS) 100 MG tablet Take 1 tablet (100 mg total) by mouth every 12 (twelve) hours. 28 tablet 0  . glipiZIDE (GLUCOTROL XL) 10 MG 24 hr tablet Take 10 mg by mouth 2 (two) times daily.    Marland Kitchen lisinopril (PRINIVIL,ZESTRIL) 5 MG tablet Take 5 mg by mouth daily.    . metFORMIN (GLUCOPHAGE) 1000 MG tablet Take 1,000 mg by mouth 2 (two) times daily with a meal.    . oxyCODONE (OXY IR/ROXICODONE) 5 MG immediate release tablet Take 1 tablet (5 mg total) by mouth every 3 (three) hours as needed for moderate pain or breakthrough pain. 15 tablet 0  . pioglitazone (ACTOS) 30 MG tablet Take 30 mg by mouth daily.    .  predniSONE (DELTASONE) 20 MG tablet Take 2 tablets (40 mg total) by mouth daily. 10 tablet 0  . promethazine-dextromethorphan (PROMETHAZINE-DM) 6.25-15 MG/5ML syrup Take 5 mLs by mouth 4 (four) times daily as needed for cough. 118 mL 0  . simvastatin (ZOCOR) 10 MG tablet Take 5 mg by mouth every evening.     . sulfamethoxazole-trimethoprim (BACTRIM DS,SEPTRA DS) 800-160 MG tablet Take 2 tablets by mouth 2 (two) times daily.     No current facility-administered medications for this visit.      Past Surgical History:  Procedure Laterality Date  . COLONOSCOPY N/A 02/26/2016   Procedure: COLONOSCOPY;  Surgeon: Daneil Dolin, MD;  Location: [facility];  Service: Endoscopy;  Laterality: N/A;  8:30 Am  . HIP SURGERY Right   . LEG SURGERY Left      Allergies  Allergen Reactions  . Sulfamethoxazole-Trimethoprim Nausea And Vomiting      Family History  Problem Relation Age of Onset  . Lung cancer Mother   . Hiatal hernia Mother   . Heart Problems Father        Pacemaker  . CAD Father   . Benign prostatic hyperplasia Father      Social History Mr. Hewett reports that he has quit smoking. His smoking use included cigarettes. He has never used smokeless  tobacco. Mr. Lagrow reports that he does not drink alcohol.   Review of Systems CONSTITUTIONAL: No weight loss, fever, chills, weakness or fatigue.  HEENT: Eyes: No visual loss, blurred vision, double vision or yellow sclerae.No hearing loss, sneezing, congestion, runny nose or sore throat.  SKIN: No rash or itching.  CARDIOVASCULAR: per hpi RESPIRATORY: per hpi  GASTROINTESTINAL: No anorexia, nausea, vomiting or diarrhea. No abdominal pain or blood.  GENITOURINARY: No burning on urination, no polyuria NEUROLOGICAL: No headache, dizziness, syncope, paralysis, ataxia, numbness or tingling in the extremities. No change in bowel or bladder control.  MUSCULOSKELETAL: No muscle, back pain, joint pain or stiffness.  LYMPHATICS:  No enlarged nodes. No history of splenectomy.  PSYCHIATRIC: No history of depression or anxiety.  ENDOCRINOLOGIC: No reports of sweating, cold or heat intolerance. No polyuria or polydipsia.  Marland Kitchen   Physical Examination Vitals:   08/23/17 1440 08/23/17 1449  BP: 130/74 136/70  Pulse: 89 91  SpO2: 93% 97%   Vitals:   08/23/17 1440  Weight: (!) 353 lb 3.2 oz (160.2 kg)  Height: 5\' 8"  (1.727 m)    Gen: resting comfortably, no acute distress HEENT: no scleral icterus, pupils equal round and reactive, no palptable cervical adenopathy,  CV: RRR, no m/r/g, no jvd Resp: Clear to auscultation bilaterally GI: abdomen is soft, non-tender, non-distended, normal bowel sounds, no hepatosplenomegaly MSK: extremities are warm, no edema.  Skin: warm, no rash Neuro:  no focal deficits Psych: appropriate affect    Assessment and Plan  1. Chest pain - isolated episode in setting of new inhaler use - monitor at thist time, if recurrent consider ischemic testing at that time  2. OSA/Possible RV dysfunction - continue cpap - reports history of RV dysfunction, details unclear. We will obtian echo.    F/u pending echo results and symptoms.    Arnoldo Lenis, M.D.

## 2017-08-23 NOTE — Patient Instructions (Signed)
Medication Instructions:  Your physician recommends that you continue on your current medications as directed. Please refer to the Current Medication list given to you today.   Labwork: NONE  Testing/Procedures: Your physician has requested that you have an echocardiogram. Echocardiography is a painless test that uses sound waves to create images of your heart. It provides your doctor with information about the size and shape of your heart and how well your heart's chambers and valves are working. This procedure takes approximately one hour. There are no restrictions for this procedure.    Follow-Up: Your physician recommends that you schedule a follow-up appointment in: PENDING TEST RESULTS    Any Other Special Instructions Will Be Listed Below (If Applicable).     If you need a refill on your cardiac medications before your next appointment, please call your pharmacy.

## 2017-08-24 ENCOUNTER — Emergency Department (HOSPITAL_COMMUNITY)
Admission: EM | Admit: 2017-08-24 | Discharge: 2017-08-24 | Disposition: A | Discharge status: Home / Self Care | Payer: Medicare Other | Attending: Emergency Medicine | Admitting: Emergency Medicine

## 2017-08-24 ENCOUNTER — Telehealth: Payer: Self-pay | Admitting: Student

## 2017-08-24 ENCOUNTER — Emergency Department (HOSPITAL_COMMUNITY)
Admission: EM | Admit: 2017-08-24 | Discharge: 2017-08-25 | Disposition: A | Discharge status: Home / Self Care | Payer: Medicare Other | Source: Home / Self Care | Attending: Emergency Medicine | Admitting: Emergency Medicine

## 2017-08-24 ENCOUNTER — Encounter (HOSPITAL_COMMUNITY): Payer: Self-pay | Admitting: *Deleted

## 2017-08-24 ENCOUNTER — Other Ambulatory Visit: Payer: Self-pay

## 2017-08-24 ENCOUNTER — Encounter (HOSPITAL_COMMUNITY): Payer: Self-pay | Admitting: Emergency Medicine

## 2017-08-24 ENCOUNTER — Emergency Department (HOSPITAL_COMMUNITY): Payer: Medicare Other

## 2017-08-24 ENCOUNTER — Other Ambulatory Visit: Payer: Self-pay | Admitting: Student

## 2017-08-24 DIAGNOSIS — R0602 Shortness of breath: Secondary | ICD-10-CM | POA: Insufficient documentation

## 2017-08-24 DIAGNOSIS — Z79899 Other long term (current) drug therapy: Secondary | ICD-10-CM | POA: Insufficient documentation

## 2017-08-24 DIAGNOSIS — R42 Dizziness and giddiness: Secondary | ICD-10-CM | POA: Insufficient documentation

## 2017-08-24 DIAGNOSIS — Z87891 Personal history of nicotine dependence: Secondary | ICD-10-CM | POA: Insufficient documentation

## 2017-08-24 DIAGNOSIS — E119 Type 2 diabetes mellitus without complications: Secondary | ICD-10-CM | POA: Insufficient documentation

## 2017-08-24 DIAGNOSIS — R0789 Other chest pain: Secondary | ICD-10-CM

## 2017-08-24 DIAGNOSIS — R079 Chest pain, unspecified: Secondary | ICD-10-CM

## 2017-08-24 DIAGNOSIS — F419 Anxiety disorder, unspecified: Secondary | ICD-10-CM

## 2017-08-24 DIAGNOSIS — Z7984 Long term (current) use of oral hypoglycemic drugs: Secondary | ICD-10-CM | POA: Insufficient documentation

## 2017-08-24 DIAGNOSIS — Z7982 Long term (current) use of aspirin: Secondary | ICD-10-CM | POA: Diagnosis not present

## 2017-08-24 DIAGNOSIS — F411 Generalized anxiety disorder: Secondary | ICD-10-CM

## 2017-08-24 LAB — CBC WITH DIFFERENTIAL/PLATELET
Basophils Absolute: 0 10*3/uL (ref 0.0–0.1)
Basophils Relative: 0 %
Eosinophils Absolute: 0.2 10*3/uL (ref 0.0–0.7)
Eosinophils Relative: 2 %
HEMATOCRIT: 42.4 % (ref 39.0–52.0)
HEMOGLOBIN: 14.1 g/dL (ref 13.0–17.0)
LYMPHS ABS: 1.4 10*3/uL (ref 0.7–4.0)
LYMPHS PCT: 15 %
MCH: 29.2 pg (ref 26.0–34.0)
MCHC: 33.3 g/dL (ref 30.0–36.0)
MCV: 87.8 fL (ref 78.0–100.0)
Monocytes Absolute: 0.9 10*3/uL (ref 0.1–1.0)
Monocytes Relative: 10 %
NEUTROS ABS: 6.6 10*3/uL (ref 1.7–7.7)
NEUTROS PCT: 73 %
Platelets: 145 10*3/uL — ABNORMAL LOW (ref 150–400)
RBC: 4.83 MIL/uL (ref 4.22–5.81)
RDW: 14.2 % (ref 11.5–15.5)
WBC: 9 10*3/uL (ref 4.0–10.5)

## 2017-08-24 LAB — BRAIN NATRIURETIC PEPTIDE: B Natriuretic Peptide: 50 pg/mL (ref 0.0–100.0)

## 2017-08-24 LAB — D-DIMER, QUANTITATIVE: D-Dimer, Quant: 0.41 ug/mL-FEU (ref 0.00–0.50)

## 2017-08-24 LAB — COMPREHENSIVE METABOLIC PANEL
ALK PHOS: 74 U/L (ref 38–126)
ALT: 31 U/L (ref 17–63)
AST: 19 U/L (ref 15–41)
Albumin: 3.7 g/dL (ref 3.5–5.0)
Anion gap: 9 (ref 5–15)
BILIRUBIN TOTAL: 1.2 mg/dL (ref 0.3–1.2)
BUN: 27 mg/dL — AB (ref 6–20)
CALCIUM: 9 mg/dL (ref 8.9–10.3)
CO2: 25 mmol/L (ref 22–32)
CREATININE: 1.26 mg/dL — AB (ref 0.61–1.24)
Chloride: 102 mmol/L (ref 101–111)
GFR calc Af Amer: >60 mL/min (ref >60)
GFR, EST NON AFRICAN AMERICAN: 57 mL/min — AB (ref >60)
GLUCOSE: 166 mg/dL — AB (ref 65–99)
Potassium: 4 mmol/L (ref 3.5–5.1)
Sodium: 136 mmol/L (ref 135–145)
Total Protein: 7.2 g/dL (ref 6.5–8.1)

## 2017-08-24 LAB — TROPONIN I
Troponin I: <0.03 ng/mL (ref <0.03)
Troponin I: <0.03 ng/mL (ref <0.03)

## 2017-08-24 NOTE — ED Triage Notes (Signed)
Pt c/o SOB that started this morning around 0500. Denies cough, fever. Pt reports one episode of right sided chest pain this morning that has now resolved.

## 2017-08-24 NOTE — ED Provider Notes (Signed)
[facility] EMERGENCY DEPARTMENT Provider Note   CSN: [id number] Arrival date & time: 08/24/17  2247     History   Chief Complaint Chief Complaint  Patient presents with  . Shortness of Breath    HPI [name] is a 67 y.o. male.  HPI   [name] is a 67 y.o. male, with a history of DM and hypercholesterolemia, presenting to the ED with feelings of anxiety this evening.  States he was lying in bed when he began to feel anxious.  He then began to feel lightheaded, feel short of breath, and have chest discomfort.  Symptoms lasted for about 10-20 minutes and began to improve after patient removed his CPAP.  Continued to improve as the patient got out of bed and walked into the kitchen. He has no complaints remaining upon my interview. He states he has been under an increased amount of stress over the past few days due to stressors at home and this has made him more anxious.  Denies fever/chills, cough, diaphoresis, N/V/D, abdominal pain, orthopnea, acute peripheral edema, syncope, or any other complaints.     Past Medical History:  Diagnosis Date  . Diabetes mellitus   . Hypercholesteremia   . Peripheral neuropathy   . Sleep apnea     Patient Active Problem List   Diagnosis Date Noted  . Cellulitis of left lower extremity 10/18/2016  . Diabetes mellitus 10/18/2016  . Hypercholesteremia 10/18/2016  . Sleep apnea 10/18/2016    Past Surgical History:  Procedure Laterality Date  . COLONOSCOPY N/A 02/26/2016   Procedure: COLONOSCOPY;  Surgeon: Daneil Dolin, MD;  Location: [facility];  Service: Endoscopy;  Laterality: N/A;  8:30 Am  . HIP SURGERY Right   . LEG SURGERY Left         Home Medications    Prior to Admission medications   Medication Sig Start Date End Date Taking? Authorizing Provider  aspirin EC 81 MG tablet Take 81 mg by mouth daily.    [provider]  glipiZIDE (GLUCOTROL XL) 10 MG 24 hr tablet Take 10 mg by mouth 2 (two)  times daily. 07/20/14   [provider]  lisinopril (PRINIVIL,ZESTRIL) 5 MG tablet Take 5 mg by mouth daily. 06/20/14   [provider]  metFORMIN (GLUCOPHAGE) 1000 MG tablet Take 1,000 mg by mouth 2 (two) times daily with a meal.    [provider]  Multiple Vitamins-Minerals (MULTIVITAMIN ADULT PO) Take 1 tablet by mouth daily.    [provider]  pioglitazone (ACTOS) 30 MG tablet Take 30 mg by mouth daily.    [provider]  Probiotic Product (PROBIOTIC-10 PO) Take 1 packet by mouth daily.    [provider]  ranitidine (ZANTAC) 150 MG tablet Take 1 tablet by mouth daily. 07/26/17   [provider]  simvastatin (ZOCOR) 10 MG tablet Take 5 mg by mouth every evening.  06/20/14   [provider]  vitamin B-12 (CYANOCOBALAMIN) 500 MCG tablet Take 500 mcg by mouth daily.    [provider]    Family History Family History  Problem Relation Age of Onset  . Lung cancer Mother   . Hiatal hernia Mother   . Heart Problems Father        Pacemaker  . CAD Father   . Benign prostatic hyperplasia Father     Social History Social History   Tobacco Use  . Smoking status: Former Smoker    Types: Cigarettes  . Smokeless  tobacco: Never Used  Substance Use Topics  . Alcohol use: No  . Drug use: No     Allergies   Fish allergy and Sulfamethoxazole-trimethoprim   Review of Systems Review of Systems  Constitutional: Negative for chills, diaphoresis and fever.  Respiratory: Positive for shortness of breath. Negative for cough.   Cardiovascular: Positive for chest pain. Negative for leg swelling.  Gastrointestinal: Negative for abdominal pain, diarrhea, nausea and vomiting.  Musculoskeletal: Negative for back pain.  Neurological: Positive for light-headedness. Negative for syncope and weakness.  All other systems reviewed and are negative.    Physical Exam Updated Vital Signs BP 125/61   Pulse 74   Temp 98.6 F  (37 C) (Temporal)   Resp 18   SpO2 97%   Physical Exam  Constitutional: He appears well-developed and well-nourished. No distress.  HENT:  Head: Normocephalic and atraumatic.  Eyes: Conjunctivae are normal.  Neck: Neck supple.  Cardiovascular: Normal rate, regular rhythm, normal heart sounds and intact distal pulses.  Pulmonary/Chest: Effort normal and breath sounds normal. No respiratory distress.  No increased work of breathing.  Speaks in full sentences without difficulty.  Abdominal: Soft. There is no tenderness. There is no guarding.  Musculoskeletal: He exhibits no edema.  Lymphadenopathy:    He has no cervical adenopathy.  Neurological: He is alert.  Skin: Skin is warm and dry. He is not diaphoretic.  Psychiatric: He has a normal mood and affect. His behavior is normal.  Nursing note and vitals reviewed.    ED Treatments / Results  Labs (all labs ordered are listed, but only abnormal results are displayed) Labs Reviewed  TROPONIN I    EKG None  ED ECG REPORT   Date: 08/24/2017  Rate: 76  Rhythm: Sinus rhythm  QRS Axis: left  Intervals: PR prolonged  ST/T Wave abnormalities: normal  Conduction Disutrbances:first-degree A-V block   Narrative Interpretation:   Old EKG Reviewed: unchanged  I have personally reviewed the EKG tracing and agree with the computerized printout as noted.  Radiology Dg Chest 2 View  Result Date: 08/24/2017 CLINICAL DATA:  Shortness of breath EXAM: CHEST - 2 VIEW COMPARISON:  June 29, 2017 FINDINGS: Lungs are clear. Heart size and pulmonary vascularity are normal. No adenopathy. No bone lesions. IMPRESSION: No edema or consolidation. Electronically Signed   By: Lowella Grip III M.D.   On: 08/24/2017 08:51    Procedures Procedures (including critical care time)  Medications Ordered in ED Medications - No data to display   Initial Impression / Assessment and Plan / ED Course  I have reviewed the triage vital signs and the  nursing notes.  Pertinent labs & imaging results that were available during my care of the patient were reviewed by me and considered in my medical decision making (see chart for details).  Clinical Course as of Aug 25 125  Wed Aug 25, 2017  0057 Patient continues to be symptom-free.   [SJ]    Clinical Course User Index [SJ] Lauri Purdum C, PA-C    Patient presents with an episode of feeling anxious while lying in bed.  Patient's symptoms improved after he removed the CPAP and as he began moving around and exerting himself.  For this reason, this event does not seem to be consistent with ischemia.  Patient was evaluated by Dr. Harl Bowie, cardiologist, on 5/13.  He was scheduled for an echocardiogram and a stress test on May 22 and 23, respectively.  Patient was again evaluated by cardiology earlier  in the day on 5/14.  The previous recommendations by Dr. Harl Bowie still stood.  During that ED visit, he had negative delta troponins and negative d-dimer.  No acute abnormalities on chest x-ray.  He has now had three negative troponins over the last 12 hours.  Patient has had no changes in his EKG.   Patient was encouraged to follow-up as previously instructed.  Return precautions discussed.  Patient voices understanding of all instructions and is comfortable with discharge.  Findings and plan of care discussed with Varney Biles, MD. Dr. Kathrynn Humble personally evaluated and examined this patient.  Vitals:   08/24/17 2303 08/24/17 2314 08/24/17 2330 08/25/17 0000  BP: 125/61  128/68 (!) 129/57  Pulse:  74 71 68  Resp:  18 16 13   Temp:      TempSrc:      SpO2:  97% 95% 96%     Final Clinical Impressions(s) / ED Diagnoses   Final diagnoses:  Anxious reaction  Lightheadedness  Chest discomfort    ED Discharge Orders    None       Layla Maw 08/25/17 [place], Ankit, MD 08/25/17 [phone]

## 2017-08-24 NOTE — Telephone Encounter (Addendum)
   Patient was recently evaluated by Dr. Harl Bowie in clinic on 08/23/2017 as a new patient consult and is currently in the Emergency Department for evaluation of chest pain. EKG shows no acute ischemic changes and initial troponin has been negative. Delta troponin is pending. Dr. Domenic Polite has recommended an outpatient NST for further ischemic evaluation. Will be a 2-day study secondary to the patient's weight.   The has been scheduled for 5/22 and 09/02/2017. Will need to be NPO after midnight leading up to testing. Echocardiogram has been rescheduled to 09/01/2017. Instructions for testing have been attached to his AVS and I reviewed this information with the patient and his wife at the bedside.   Signed, Erma Heritage, PA-C 08/24/2017, 11:49 AM Pager: [phone]

## 2017-08-24 NOTE — ED Triage Notes (Signed)
Pt c/o sob and anxiety while trying to go to bed. Pt states he was seen earlier today for the same.

## 2017-08-24 NOTE — ED Notes (Signed)
Pt given PO fluid per DM Zammit.

## 2017-08-24 NOTE — Discharge Instructions (Addendum)
° °  You have a Stress Test scheduled at [facility]. Your doctor has ordered this test to check the blood flow in your heart arteries. ARRIVE AT 9:15AM ON 09/01/2017.     Follow-up as planned with the cardiologist.  Return sooner if any problems  Please arrive 15 minutes early for paperwork. The whole test will take several hours. You may want to bring reading material to remain occupied while undergoing different parts of the test.  Instructions: No food/drink after midnight the night before. It is OK to take your morning meds with a sip of water EXCEPT for those types of medicines listed below or otherwise instructed. No caffeine/decaf products 24 hours before, including medicines such as Excedrin or Goody Powders. Call if there are any questions.  Wear comfortable clothes and shoes.   Special Medication Instructions: Beta blockers such as metoprolol (Lopressor/Toprol XL), atenolol (Tenormin), carvedilol (Coreg), nebivolol (Bystolic), bisoprolol (Zebeta), propranolol (Inderal) should not be taken for 24 hours before the test. Calcium channel blockers such as diltiazem (Cardizem) or verapmil (Calan) should not be taken for 24 hours before the test. Remove nitroglycerin patches and do not take nitrate preparations such as Imdur/isosorbide the day of your test. No Persantine/Theophylline or Aggrenox medicines should be used within 24 hours of the test.  If you are diabetic, please ask which medications to hold the day of the test.  What To Expect: When you arrive in the lab, the technician will inject a small amount of radioactive tracer into your arm through an IV while you are resting quietly. This helps [place] to form pictures of your heart. You will likely only feel a sting from the IV. After a waiting period, resting pictures will be obtained under a big camera. These are the "before" pictures.  Next, you will be prepped for the stress portion of the test. This may include either walking  on a treadmill or receiving a medicine that helps to dilate blood vessels in your heart to simulate the effect of exercise on your heart. If you are walking on a treadmill, you will walk at different paces to try to get your heart rate to a goal number that is based on your age. If your doctor has chosen the pharmacologic test, then you will receive a medicine through your IV that may cause temporary nausea, flushing, shortness of breath and sometimes chest discomfort or vomiting. This is typically short-lived and usually resolves quickly. If you experience symptoms, that does not automatically mean the test is abnormal. Some patients do not experience any symptoms at all. Your blood pressure and heart rate will be monitored, and we will be watching your EKG on a computer screen for any changes. During this portion of the test, the radiologist will inject another small amount of radioactive tracer into your IV. After a waiting period, you will undergo a second set of pictures. These are the "after" pictures.  The doctor reading the test will compare the before-and-after images to look for evidence of heart blockages or heart weakness. The test usually takes 1 day to complete, but in certain instances (for example, if a patient is over a certain weight limit), the test may be done over the span of 2 days.

## 2017-08-24 NOTE — ED Provider Notes (Signed)
[facility] EMERGENCY DEPARTMENT Provider Note   CSN: [id number] Arrival date & time: 08/24/17  0703     History   Chief Complaint Chief Complaint  Patient presents with  . Shortness of Breath    HPI [name] is a 67 y.o. male.  Patient states that he had some chest pain earlier today and then had a longer episode of shortness of breath.  He was actually evaluated by cardiology yesterday  The history is provided by the patient. No language interpreter was used.  Shortness of Breath  This is a new problem. The problem occurs rarely.The problem has been resolved. Pertinent negatives include no fever, no headaches, no cough, no chest pain, no abdominal pain and no rash. Precipitated by: Exertion. He has tried nothing for the symptoms. The treatment provided no relief. He has had prior hospitalizations. He has had prior ED visits. Associated medical issues do not include pneumonia.    Past Medical History:  Diagnosis Date  . Diabetes mellitus   . Hypercholesteremia   . Peripheral neuropathy   . Sleep apnea     Patient Active Problem List   Diagnosis Date Noted  . Cellulitis of left lower extremity 10/18/2016  . Diabetes mellitus 10/18/2016  . Hypercholesteremia 10/18/2016  . Sleep apnea 10/18/2016    Past Surgical History:  Procedure Laterality Date  . COLONOSCOPY N/A 02/26/2016   Procedure: COLONOSCOPY;  Surgeon: Daneil Dolin, MD;  Location: [facility];  Service: Endoscopy;  Laterality: N/A;  8:30 Am  . HIP SURGERY Right   . LEG SURGERY Left         Home Medications    Prior to Admission medications   Medication Sig Start Date End Date Taking? Authorizing Provider  aspirin EC 81 MG tablet Take 81 mg by mouth daily.   Yes [provider]  glipiZIDE (GLUCOTROL XL) 10 MG 24 hr tablet Take 10 mg by mouth 2 (two) times daily. 07/20/14  Yes [provider]  lisinopril (PRINIVIL,ZESTRIL) 5 MG tablet Take 5 mg by mouth daily. 06/20/14  Yes  [provider]  metFORMIN (GLUCOPHAGE) 1000 MG tablet Take 1,000 mg by mouth 2 (two) times daily with a meal.   Yes [provider]  Multiple Vitamins-Minerals (MULTIVITAMIN ADULT PO) Take 1 tablet by mouth daily.   Yes [provider]  pioglitazone (ACTOS) 30 MG tablet Take 30 mg by mouth daily.   Yes [provider]  Probiotic Product (PROBIOTIC-10 PO) Take 1 packet by mouth daily.   Yes [provider]  ranitidine (ZANTAC) 150 MG tablet Take 1 tablet by mouth daily. 07/26/17  Yes [provider]  simvastatin (ZOCOR) 10 MG tablet Take 5 mg by mouth every evening.  06/20/14  Yes [provider]  vitamin B-12 (CYANOCOBALAMIN) 500 MCG tablet Take 500 mcg by mouth daily.   Yes [provider]    Family History Family History  Problem Relation Age of Onset  . Lung cancer Mother   . Hiatal hernia Mother   . Heart Problems Father        Pacemaker  . CAD Father   . Benign prostatic hyperplasia Father     Social History Social History   Tobacco Use  . Smoking status: Former Smoker    Types: Cigarettes  . Smokeless tobacco: Never Used  Substance Use Topics  . Alcohol use: No  . Drug use: No     Allergies   Fish allergy and Sulfamethoxazole-trimethoprim   Review  of Systems Review of Systems  Constitutional: Negative for appetite change, fatigue and fever.  HENT: Negative for congestion, ear discharge and sinus pressure.   Eyes: Negative for discharge.  Respiratory: Positive for chest tightness and shortness of breath. Negative for cough.   Cardiovascular: Negative for chest pain.  Gastrointestinal: Negative for abdominal pain and diarrhea.  Genitourinary: Negative for frequency and hematuria.  Musculoskeletal: Negative for back pain.  Skin: Negative for rash.  Neurological: Negative for seizures and headaches.  Psychiatric/Behavioral: Negative for hallucinations.     Physical Exam Updated Vital  Signs BP (!) 109/55   Pulse 61   Temp 97.8 F (36.6 C) (Oral)   Resp 16   Ht 5\' 8"  (1.727 m)   Wt (!) 160.1 kg (353 lb)   SpO2 98%   BMI 53.67 kg/m   Physical Exam  Constitutional: He is oriented to person, place, and time. He appears well-developed.  HENT:  Head: Normocephalic.  Eyes: Conjunctivae and EOM are normal. No scleral icterus.  Neck: Neck supple. No thyromegaly present.  Cardiovascular: Normal rate and regular rhythm. Exam reveals no gallop and no friction rub.  No murmur heard. Pulmonary/Chest: No stridor. He has no wheezes. He has no rales. He exhibits no tenderness.  Abdominal: He exhibits no distension. There is no tenderness. There is no rebound.  Musculoskeletal: Normal range of motion. He exhibits no edema.  Lymphadenopathy:    He has no cervical adenopathy.  Neurological: He is oriented to person, place, and time. He exhibits normal muscle tone. Coordination normal.  Skin: No rash noted. No erythema.  Psychiatric: He has a normal mood and affect. His behavior is normal.     ED Treatments / Results  Labs (all labs ordered are listed, but only abnormal results are displayed) Labs Reviewed  CBC WITH DIFFERENTIAL/PLATELET - Abnormal; Notable for the following components:      Result Value   Platelets 145 (*)    All other components within normal limits  COMPREHENSIVE METABOLIC PANEL - Abnormal; Notable for the following components:   Glucose, Bld 166 (*)    BUN 27 (*)    Creatinine, Ser 1.26 (*)    GFR calc non Af Amer 57 (*)    All other components within normal limits  D-DIMER, QUANTITATIVE (NOT AT [facility])  TROPONIN I  BRAIN NATRIURETIC PEPTIDE  TROPONIN I    EKG EKG Interpretation  Date/Time:  Tuesday Aug 24 2017 07:17:02 EDT Ventricular Rate:  69 PR Interval:    QRS Duration: 96 QT Interval:  395 QTC Calculation: 424 R Axis:   -29 Text Interpretation:  Sinus arrhythmia Multiple ventricular premature complexes Prolonged PR interval Inferior  infarct, old Confirmed by Milton Ferguson [phone]) on 08/24/2017 10:45:34 AM   Radiology Dg Chest 2 View  Result Date: 08/24/2017 CLINICAL DATA:  Shortness of breath EXAM: CHEST - 2 VIEW COMPARISON:  June 29, 2017 FINDINGS: Lungs are clear. Heart size and pulmonary vascularity are normal. No adenopathy. No bone lesions. IMPRESSION: No edema or consolidation. Electronically Signed   By: Lowella Grip III M.D.   On: 08/24/2017 08:51    Procedures Procedures (including critical care time)  Medications Ordered in ED Medications - No data to display   Initial Impression / Assessment and Plan / ED Course  I have reviewed the triage vital signs and the nursing notes.  Pertinent labs & imaging results that were available during my care of the patient were reviewed by me and considered in my medical decision  making (see chart for details).   Labs unremarkable including 2 troponins d-dimer CBC and chemistries.  Chest x-ray no acute changes.  I spoke with cardiology about the patient and they felt like the patient could be discharged home they are going to arrange a outpatient stress test.    Final Clinical Impressions(s) / ED Diagnoses   Final diagnoses:  SOB (shortness of breath)    ED Discharge Orders    None       Milton Ferguson, MD 08/24/17 1220

## 2017-08-24 NOTE — ED Notes (Signed)
Pt back from Radiology. 

## 2017-08-25 LAB — TROPONIN I: Troponin I: <0.03 ng/mL (ref <0.03)

## 2017-08-25 NOTE — Discharge Instructions (Addendum)
Follow-up with cardiology, as previously directed.  Return to the ED as needed.

## 2017-08-29 ENCOUNTER — Encounter: Payer: Self-pay | Admitting: Cardiology

## 2017-09-01 ENCOUNTER — Encounter (HOSPITAL_BASED_OUTPATIENT_CLINIC_OR_DEPARTMENT_OTHER)
Admission: RE | Admit: 2017-09-01 | Discharge: 2017-09-01 | Disposition: A | Discharge status: Home / Self Care | Payer: Medicare Other | Source: Ambulatory Visit | Attending: Student | Admitting: Student

## 2017-09-01 ENCOUNTER — Encounter (HOSPITAL_COMMUNITY)
Admission: RE | Admit: 2017-09-01 | Discharge: 2017-09-01 | Disposition: A | Discharge status: Home / Self Care | Payer: Medicare Other | Source: Ambulatory Visit | Attending: Student | Admitting: Student

## 2017-09-01 ENCOUNTER — Ambulatory Visit (HOSPITAL_BASED_OUTPATIENT_CLINIC_OR_DEPARTMENT_OTHER)
Admission: RE | Admit: 2017-09-01 | Discharge: 2017-09-01 | Disposition: A | Discharge status: Home / Self Care | Payer: Medicare Other | Source: Ambulatory Visit | Attending: Cardiology | Admitting: Cardiology

## 2017-09-01 DIAGNOSIS — Z87891 Personal history of nicotine dependence: Secondary | ICD-10-CM | POA: Insufficient documentation

## 2017-09-01 DIAGNOSIS — I088 Other rheumatic multiple valve diseases: Secondary | ICD-10-CM | POA: Insufficient documentation

## 2017-09-01 DIAGNOSIS — E119 Type 2 diabetes mellitus without complications: Secondary | ICD-10-CM | POA: Insufficient documentation

## 2017-09-01 DIAGNOSIS — E785 Hyperlipidemia, unspecified: Secondary | ICD-10-CM | POA: Insufficient documentation

## 2017-09-01 DIAGNOSIS — R0789 Other chest pain: Secondary | ICD-10-CM | POA: Diagnosis not present

## 2017-09-01 DIAGNOSIS — R079 Chest pain, unspecified: Secondary | ICD-10-CM

## 2017-09-01 MED ORDER — TECHNETIUM TC 99M TETROFOSMIN IV KIT
30.0000 | PACK | Freq: Once | INTRAVENOUS | Status: AC | PRN
  Administered 2017-09-01: 28 via INTRAVENOUS

## 2017-09-01 MED ORDER — SODIUM CHLORIDE 0.9% FLUSH
INTRAVENOUS | Status: AC
  Administered 2017-09-01: 10 mL via INTRAVENOUS
  Filled 2017-09-01: qty 10

## 2017-09-01 MED ORDER — PERFLUTREN LIPID MICROSPHERE
1.0000 mL | INTRAVENOUS | Status: AC | PRN
  Administered 2017-09-01: 2 mL via INTRAVENOUS
  Filled 2017-09-01: qty 10

## 2017-09-01 MED ORDER — REGADENOSON 0.4 MG/5ML IV SOLN
INTRAVENOUS | Status: AC
  Administered 2017-09-01: 0.4 mg via INTRAVENOUS
  Filled 2017-09-01: qty 5

## 2017-09-01 NOTE — Progress Notes (Signed)
*  PRELIMINARY RESULTS* Echocardiogram 2D Echocardiogram WITH DEFINITY has been performed.  Leavy Cella 09/01/2017, 1:15 PM

## 2017-09-02 ENCOUNTER — Encounter (HOSPITAL_COMMUNITY)
Admission: RE | Admit: 2017-09-02 | Discharge: 2017-09-02 | Disposition: A | Discharge status: Home / Self Care | Payer: Medicare Other | Source: Ambulatory Visit | Attending: Student | Admitting: Student

## 2017-09-02 ENCOUNTER — Telehealth: Payer: Self-pay | Admitting: *Deleted

## 2017-09-02 LAB — NM MYOCAR MULTI W/SPECT W/WALL MOTION / EF
CHL CUP NUCLEAR SDS: 1
CHL CUP RESTING HR STRESS: 85 {beats}/min
CSEPPHR: 99 {beats}/min
LHR: 0.68
LVDIAVOL: 87 mL (ref 62–150)
LVSYSVOL: 26 mL
SRS: 0
SSS: 1
TID: 0.84

## 2017-09-02 MED ORDER — TECHNETIUM TC 99M TETROFOSMIN IV KIT
10.0000 | PACK | Freq: Once | INTRAVENOUS | Status: AC | PRN
  Administered 2017-09-02: 26 via INTRAVENOUS

## 2017-09-02 NOTE — Telephone Encounter (Signed)
-----   Message from Charlie Pitter, [place] sent at 09/02/2017  5:19 PM EDT ----- Please let patient know nuc was normal - the echo will be in Dr. Nelly Laurence box for his review when he returns for vacation to decide further w/u for elevated pressures in heart (squeeze function was normal - see echo report). Notes indicate he is to have a f/u appt pending test results but do not see this scheduled. Await input from MD. Melina Copa PA-C

## 2017-09-02 NOTE — Telephone Encounter (Signed)
Called patient with test results. No answer. Left message to call back.  

## 2017-09-03 ENCOUNTER — Telehealth: Payer: Self-pay | Admitting: *Deleted

## 2017-09-03 NOTE — Telephone Encounter (Signed)
Pt made aware and voiced understanding - routed to pcp - will message Dr Harl Bowie on when pt is to f/u

## 2017-09-03 NOTE — Telephone Encounter (Signed)
-----   Message from Charlie Pitter, [place] sent at 09/02/2017  5:19 PM EDT ----- Please let patient know nuc was normal - the echo will be in Dr. Nelly Laurence box for his review when he returns for vacation to decide further w/u for elevated pressures in heart (squeeze function was normal - see echo report). Notes indicate he is to have a f/u appt pending test results but do not see this scheduled. Await input from MD. Melina Copa PA-C

## 2017-09-09 ENCOUNTER — Other Ambulatory Visit: Payer: Self-pay

## 2017-09-17 MED FILL — Perflutren Lipid Microsphere IV Susp 6.52 MG/ML: INTRAVENOUS | Qty: 2 | Status: AC

## 2017-11-21 ENCOUNTER — Other Ambulatory Visit: Payer: Self-pay

## 2017-11-21 ENCOUNTER — Encounter (HOSPITAL_COMMUNITY): Payer: Self-pay | Admitting: Emergency Medicine

## 2017-11-21 ENCOUNTER — Emergency Department (HOSPITAL_COMMUNITY)
Admission: EM | Admit: 2017-11-21 | Discharge: 2017-11-21 | Disposition: A | Discharge status: Home / Self Care | Payer: Medicare Other | Attending: Emergency Medicine | Admitting: Emergency Medicine

## 2017-11-21 ENCOUNTER — Emergency Department (HOSPITAL_COMMUNITY)
Admission: EM | Admit: 2017-11-21 | Discharge: 2017-11-21 | Disposition: A | Discharge status: Home / Self Care | Payer: Medicare Other | Source: Home / Self Care | Attending: Emergency Medicine | Admitting: Emergency Medicine

## 2017-11-21 ENCOUNTER — Encounter (HOSPITAL_COMMUNITY): Payer: Self-pay

## 2017-11-21 DIAGNOSIS — E1151 Type 2 diabetes mellitus with diabetic peripheral angiopathy without gangrene: Secondary | ICD-10-CM | POA: Diagnosis not present

## 2017-11-21 DIAGNOSIS — Z87891 Personal history of nicotine dependence: Secondary | ICD-10-CM | POA: Diagnosis not present

## 2017-11-21 DIAGNOSIS — Z79899 Other long term (current) drug therapy: Secondary | ICD-10-CM | POA: Insufficient documentation

## 2017-11-21 DIAGNOSIS — Y999 Unspecified external cause status: Secondary | ICD-10-CM | POA: Insufficient documentation

## 2017-11-21 DIAGNOSIS — S0502XA Injury of conjunctiva and corneal abrasion without foreign body, left eye, initial encounter: Secondary | ICD-10-CM

## 2017-11-21 DIAGNOSIS — E119 Type 2 diabetes mellitus without complications: Secondary | ICD-10-CM | POA: Insufficient documentation

## 2017-11-21 DIAGNOSIS — X58XXXA Exposure to other specified factors, initial encounter: Secondary | ICD-10-CM

## 2017-11-21 DIAGNOSIS — Z7982 Long term (current) use of aspirin: Secondary | ICD-10-CM

## 2017-11-21 DIAGNOSIS — Y929 Unspecified place or not applicable: Secondary | ICD-10-CM

## 2017-11-21 DIAGNOSIS — T1592XA Foreign body on external eye, part unspecified, left eye, initial encounter: Secondary | ICD-10-CM | POA: Insufficient documentation

## 2017-11-21 DIAGNOSIS — Y939 Activity, unspecified: Secondary | ICD-10-CM | POA: Insufficient documentation

## 2017-11-21 MED ORDER — FLUORESCEIN SODIUM 1 MG OP STRP
1.0000 | ORAL_STRIP | Freq: Once | OPHTHALMIC | Status: AC
  Administered 2017-11-21: 1 via OPHTHALMIC
  Filled 2017-11-21: qty 1

## 2017-11-21 MED ORDER — TETRACAINE HCL 0.5 % OP SOLN
1.0000 [drp] | Freq: Once | OPHTHALMIC | Status: AC
  Administered 2017-11-21: 1 [drp] via OPHTHALMIC
  Filled 2017-11-21: qty 4

## 2017-11-21 MED ORDER — TETRACAINE HCL 0.5 % OP SOLN
2.0000 [drp] | Freq: Once | OPHTHALMIC | Status: AC
Start: 2017-11-21 — End: 2017-11-21
  Administered 2017-11-21: 2 [drp] via OPHTHALMIC
  Filled 2017-11-21: qty 4

## 2017-11-21 MED ORDER — TOBRAMYCIN 0.3 % OP SOLN
2.0000 [drp] | OPHTHALMIC | Status: AC
  Administered 2017-11-21: 2 [drp] via OPHTHALMIC
  Filled 2017-11-21: qty 5

## 2017-11-21 NOTE — Discharge Instructions (Addendum)
Apply 2 drops to your left eye every 2 hours for the first 1-2 days. Then use 1 drop every 4-6 hours  Apply drops only while awake. Follow up with your eye doctor tomorrow for recheck.

## 2017-11-21 NOTE — ED Notes (Signed)
ED Provider at bedside. 

## 2017-11-21 NOTE — ED Provider Notes (Signed)
[facility] EMERGENCY DEPARTMENT Provider Note   CSN: [id number] Arrival date & time: 11/21/17  0002     History   Chief Complaint Chief Complaint  Patient presents with  . Foreign Body in Eye    HPI [name] is a 67 y.o. male.  The history is provided by the patient.  Foreign Body in Eye  This is a new problem. The current episode started 3 to 5 hours ago. The problem occurs constantly. The problem has been gradually improving. Pertinent negatives include no headaches. Nothing aggravates the symptoms. Nothing relieves the symptoms.  PT reports he feels like he has something in his left eye.  No new injury or trauma.  He did have his  dog on his lap and there may be a hair from the dog in his left eye.  He wears glasses, but does not wear contact lenses.  He had Otherwise been well  Past Medical History:  Diagnosis Date  . Diabetes mellitus   . Hypercholesteremia   . Peripheral neuropathy   . Sleep apnea     Patient Active Problem List   Diagnosis Date Noted  . Cellulitis of left lower extremity 10/18/2016  . Diabetes mellitus 10/18/2016  . Hypercholesteremia 10/18/2016  . Sleep apnea 10/18/2016    Past Surgical History:  Procedure Laterality Date  . COLONOSCOPY N/A 02/26/2016   Procedure: COLONOSCOPY;  Surgeon: Daneil Dolin, MD;  Location: [facility];  Service: Endoscopy;  Laterality: N/A;  8:30 Am  . HIP SURGERY Right   . LEG SURGERY Left         Home Medications    Prior to Admission medications   Medication Sig Start Date End Date Taking? Authorizing Provider  aspirin EC 81 MG tablet Take 81 mg by mouth daily.    [provider]  glipiZIDE (GLUCOTROL XL) 10 MG 24 hr tablet Take 10 mg by mouth 2 (two) times daily. 07/20/14   [provider]  lisinopril (PRINIVIL,ZESTRIL) 5 MG tablet Take 5 mg by mouth daily. 06/20/14   [provider]  metFORMIN (GLUCOPHAGE) 1000 MG tablet Take 1,000 mg by mouth 2 (two) times daily with a  meal.    [provider]  Multiple Vitamins-Minerals (MULTIVITAMIN ADULT PO) Take 1 tablet by mouth daily.    [provider]  pioglitazone (ACTOS) 30 MG tablet Take 30 mg by mouth daily.    [provider]  Probiotic Product (PROBIOTIC-10 PO) Take 1 packet by mouth daily.    [provider]  ranitidine (ZANTAC) 150 MG tablet Take 1 tablet by mouth daily. 07/26/17   [provider]  simvastatin (ZOCOR) 10 MG tablet Take 5 mg by mouth every evening.  06/20/14   [provider]  vitamin B-12 (CYANOCOBALAMIN) 500 MCG tablet Take 500 mcg by mouth daily.    [provider]    Family History Family History  Problem Relation Age of Onset  . Lung cancer Mother   . Hiatal hernia Mother   . Heart Problems Father        Pacemaker  . CAD Father   . Benign prostatic hyperplasia Father     Social History Social History   Tobacco Use  . Smoking status: Former Smoker    Types: Cigarettes  . Smokeless tobacco: Never Used  Substance Use Topics  . Alcohol use: No  . Drug use: No     Allergies   Fish allergy and Sulfamethoxazole-trimethoprim   Review of Systems Review  of Systems  Constitutional: Negative for fever.  Eyes: Positive for redness, itching and visual disturbance.  Neurological: Negative for headaches.     Physical Exam Updated Vital Signs BP (!) 143/74 (BP Location: Left Arm)   Pulse 66   Temp 97.6 F (36.4 C) (Oral)   Resp 20   Ht 1.727 m (5\' 8" ) Comment: Simultaneous filing. User may not have seen previous data.  Wt (!) 149.7 kg Comment: Simultaneous filing. User may not have seen previous data.  SpO2 94%   BMI 50.18 kg/m   Physical Exam CONSTITUTIONAL: Well developed/well nourished HEAD: Normocephalic/atraumatic EYES: EOMI/PERRL, mild conjunctival erythema OS, no obvious foreign bodies, no consensual pain, no proptosis, no large foreign bodies noted, no abrasions, no corneal hazing ENMT: Mucous  membranes moist NECK: supple no meningeal signs CV: S1/S2 noted, no murmurs/rubs/gallops noted LUNGS: Lungs are clear to auscultation bilaterally, no apparent distress ABDOMEN: soft NEURO: Pt is awake/alert/appropriate, moves all extremitiesx4.  No facial droop.   EXTREMITIES:  full ROM SKIN: warm, color normal PSYCH: no abnormalities of mood noted, alert and oriented to situation   ED Treatments / Results  Labs (all labs ordered are listed, but only abnormal results are displayed) Labs Reviewed - No data to display  EKG None  Radiology No results found.  Procedures Procedures (including critical care time)  Medications Ordered in ED Medications  fluorescein ophthalmic strip 1 strip (has no administration in time range)  tetracaine (PONTOCAINE) 0.5 % ophthalmic solution 2 drop (has no administration in time range)     Initial Impression / Assessment and Plan / ED Course  I have reviewed the triage vital signs and the nursing notes.  Visual acuity 20/40 in both eyes. Exam is essentially unremarkable.  Patient's feeling improved.  Nursing will flush out his eye prior to discharge.      Final Clinical Impressions(s) / ED Diagnoses   Final diagnoses:  Foreign body of left eye, initial encounter    ED Discharge Orders    None       Ripley Fraise, MD 11/21/17 [phone]

## 2017-11-21 NOTE — ED Provider Notes (Signed)
[facility] EMERGENCY DEPARTMENT Provider Note   CSN: [id number] Arrival date & time: 11/21/17  0751     History   Chief Complaint Chief Complaint  Patient presents with  . Eye Pain    HPI [name] is a 67 y.o. male.  67yo male presents with ongoing left eye discomfort. Patient was seen early this morning with same, said his small blond chihuahua was sleeping on his chest and he may have a dog hair in his eye. Patient's pain was relieved with tetracaine drops and his eye was irrigated before dc home. Patient states he slept a few hours and then the discomfort returned, feels like a foreign body in his eye. Reports blurry vision, watery eye. Wears glasses, does not wear contacts, no trauma, does not weld or work with metal. No other complaints or concerns.      Past Medical History:  Diagnosis Date  . Diabetes mellitus   . Hypercholesteremia   . Peripheral neuropathy   . Sleep apnea     Patient Active Problem List   Diagnosis Date Noted  . Cellulitis of left lower extremity 10/18/2016  . Diabetes mellitus 10/18/2016  . Hypercholesteremia 10/18/2016  . Sleep apnea 10/18/2016    Past Surgical History:  Procedure Laterality Date  . COLONOSCOPY N/A 02/26/2016   Procedure: COLONOSCOPY;  Surgeon: Daneil Dolin, MD;  Location: [facility];  Service: Endoscopy;  Laterality: N/A;  8:30 Am  . HIP SURGERY Right   . LEG SURGERY Left         Home Medications    Prior to Admission medications   Medication Sig Start Date End Date Taking? Authorizing Provider  aspirin EC 81 MG tablet Take 81 mg by mouth daily.    [provider]  glipiZIDE (GLUCOTROL XL) 10 MG 24 hr tablet Take 10 mg by mouth 2 (two) times daily. 07/20/14   [provider]  lisinopril (PRINIVIL,ZESTRIL) 5 MG tablet Take 5 mg by mouth daily. 06/20/14   [provider]  metFORMIN (GLUCOPHAGE) 1000 MG tablet Take 1,000 mg by mouth 2 (two) times daily with a meal.    [provider]  Multiple Vitamins-Minerals (MULTIVITAMIN ADULT PO) Take 1 tablet by mouth daily.    [provider]  pioglitazone (ACTOS) 30 MG tablet Take 30 mg by mouth daily.    [provider]  Probiotic Product (PROBIOTIC-10 PO) Take 1 packet by mouth daily.    [provider]  ranitidine (ZANTAC) 150 MG tablet Take 1 tablet by mouth daily. 07/26/17   [provider]  simvastatin (ZOCOR) 10 MG tablet Take 5 mg by mouth every evening.  06/20/14   [provider]  vitamin B-12 (CYANOCOBALAMIN) 500 MCG tablet Take 500 mcg by mouth daily.    [provider]    Family History Family History  Problem Relation Age of Onset  . Lung cancer Mother   . Hiatal hernia Mother   . Heart Problems Father        Pacemaker  . CAD Father   . Benign prostatic hyperplasia Father     Social History Social History   Tobacco Use  . Smoking status: Former Smoker    Types: Cigarettes  . Smokeless tobacco: Never Used  Substance Use Topics  . Alcohol use: No  . Drug use: No     Allergies   Fish allergy and Sulfamethoxazole-trimethoprim   Review of Systems Review of Systems  Constitutional: Negative for fever.  HENT: Negative  for congestion.   Eyes: Positive for pain, discharge, redness and visual disturbance. Negative for photophobia and itching.  Skin: Negative for rash and wound.  Allergic/Immunologic: Positive for immunocompromised state.  Neurological: Negative for headaches.  Hematological: Negative for adenopathy.  Psychiatric/Behavioral: Negative for confusion.  All other systems reviewed and are negative.    Physical Exam Updated Vital Signs BP 132/73 (BP Location: Left Arm)   Pulse 85   Temp 97.7 F (36.5 C) (Oral)   Resp 16   Ht 5\' 8"  (1.727 m)   Wt (!) 149 kg   SpO2 95%   BMI 49.95 kg/m   Physical Exam  Constitutional: He is oriented to person, place, and time. He appears well-developed and well-nourished. No  distress.  HENT:  Head: Normocephalic and atraumatic.  Eyes: Pupils are equal, round, and reactive to light. EOM are normal. Lids are everted and swept, no foreign bodies found. Left eye exhibits no chemosis, no exudate and no hordeolum. Left conjunctiva is injected. Left conjunctiva has no hemorrhage.  Slit lamp exam:      The left eye shows corneal abrasion and fluorescein uptake.    Pulmonary/Chest: Effort normal.  Neurological: He is alert and oriented to person, place, and time.  Skin: He is not diaphoretic.  Psychiatric: He has a normal mood and affect. His behavior is normal.  Nursing note and vitals reviewed.    ED Treatments / Results  Labs (all labs ordered are listed, but only abnormal results are displayed) Labs Reviewed - No data to display  EKG None  Radiology No results found.  Procedures Procedures (including critical care time)  Medications Ordered in ED Medications  tobramycin (TOBREX) 0.3 % ophthalmic solution 2 drop (has no administration in time range)  tetracaine (PONTOCAINE) 0.5 % ophthalmic solution 1 drop (1 drop Left Eye Given by Other 11/21/17 7412)  fluorescein ophthalmic strip 1 strip (1 strip Left Eye Given by Other 11/21/17 8786)     Initial Impression / Assessment and Plan / ED Course  I have reviewed the triage vital signs and the nursing notes.  Pertinent labs & imaging results that were available during my care of the patient were reviewed by me and considered in my medical decision making (see chart for details).  Clinical Course as of Nov 21 840  Sun Nov 21, 2017  0841 67yo male with ongoing left eye discomfort. Pain relieved with tetracaine, small area of uptake noted with fluorescein and woods lamp, no streaming, no visible Fb. Patient given tobramycin drops in the ER with directions for use, he will contact his optometrist tomorrow morning for follow up.     [LM]    Clinical Course User Index [LM] Tacy Learn, PA-C     Final Clinical Impressions(s) / ED Diagnoses   Final diagnoses:  Abrasion of left cornea, initial encounter    ED Discharge Orders    None       Tacy Learn, PA-C 11/21/17 7672    Lajean Saver, MD 11/21/17 1100

## 2017-11-21 NOTE — ED Triage Notes (Signed)
Pt states he was here last night for left eye pain, redness, and swelling.  States it was feeling better and now pain has returned and he doesn't know what to do.

## 2017-11-21 NOTE — ED Triage Notes (Addendum)
Pt reports sitting on couch this evening around 7 pm, when he felt like something was in his eye. Pt denies any known source of foreign body that could have gotten in his eye other than a "hair from his dog". Pt denies injury

## 2019-01-27 ENCOUNTER — Other Ambulatory Visit: Payer: Self-pay

## 2019-01-27 DIAGNOSIS — Z20822 Contact with and (suspected) exposure to covid-19: Secondary | ICD-10-CM

## 2019-01-28 LAB — NOVEL CORONAVIRUS, NAA: SARS-CoV-2, NAA: NOT DETECTED

## 2019-01-31 ENCOUNTER — Encounter: Payer: Self-pay | Admitting: Internal Medicine

## 2019-02-16 IMAGING — NM NM MYOCAR MULTI W/SPECT W/WALL MOTION & EF
2 series · 12 of 12 positions shown · non-contrast
Comparison: none

[Series 1: stress gated - gated · 6.51mm/px · 6 of 503 frames shown]
[frame 42/503  full-range]
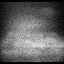
[frame 126/503  full-range]
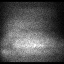
[frame 210/503  full-range]
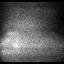
[frame 294/503  full-range]
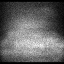
[frame 378/503  full-range]
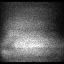
[frame 462/503  full-range]
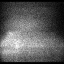

[Series 3: rest · 6.51mm/px · 6 of 64 frames shown]
[frame 6/64]
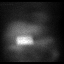
[frame 16/64]
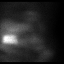
[frame 27/64]
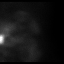
[frame 38/64]
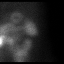
[frame 48/64]
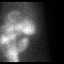
[frame 59/64]
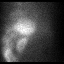

[12 of 12 positions shown; findings below may reference images not displayed]

Canned report from images found in remote index.

Refer to host system for actual result text.

## 2019-03-15 ENCOUNTER — Emergency Department (HOSPITAL_COMMUNITY)
Admission: EM | Admit: 2019-03-15 | Discharge: 2019-03-15 | Disposition: A | Discharge status: Home / Self Care | Payer: Medicare Other | Attending: Emergency Medicine | Admitting: Emergency Medicine

## 2019-03-15 ENCOUNTER — Emergency Department (HOSPITAL_COMMUNITY): Payer: Medicare Other

## 2019-03-15 ENCOUNTER — Encounter (HOSPITAL_COMMUNITY): Payer: Self-pay | Admitting: *Deleted

## 2019-03-15 ENCOUNTER — Other Ambulatory Visit: Payer: Self-pay

## 2019-03-15 DIAGNOSIS — S0990XA Unspecified injury of head, initial encounter: Secondary | ICD-10-CM

## 2019-03-15 DIAGNOSIS — W01198A Fall on same level from slipping, tripping and stumbling with subsequent striking against other object, initial encounter: Secondary | ICD-10-CM | POA: Insufficient documentation

## 2019-03-15 DIAGNOSIS — Z87891 Personal history of nicotine dependence: Secondary | ICD-10-CM | POA: Diagnosis not present

## 2019-03-15 DIAGNOSIS — Z7984 Long term (current) use of oral hypoglycemic drugs: Secondary | ICD-10-CM | POA: Diagnosis not present

## 2019-03-15 DIAGNOSIS — M25511 Pain in right shoulder: Secondary | ICD-10-CM | POA: Diagnosis not present

## 2019-03-15 DIAGNOSIS — Z23 Encounter for immunization: Secondary | ICD-10-CM | POA: Insufficient documentation

## 2019-03-15 DIAGNOSIS — Y9289 Other specified places as the place of occurrence of the external cause: Secondary | ICD-10-CM | POA: Insufficient documentation

## 2019-03-15 DIAGNOSIS — E119 Type 2 diabetes mellitus without complications: Secondary | ICD-10-CM | POA: Insufficient documentation

## 2019-03-15 DIAGNOSIS — S0031XA Abrasion of nose, initial encounter: Secondary | ICD-10-CM | POA: Insufficient documentation

## 2019-03-15 DIAGNOSIS — Z79899 Other long term (current) drug therapy: Secondary | ICD-10-CM | POA: Insufficient documentation

## 2019-03-15 DIAGNOSIS — S81812A Laceration without foreign body, left lower leg, initial encounter: Secondary | ICD-10-CM | POA: Insufficient documentation

## 2019-03-15 DIAGNOSIS — S0081XA Abrasion of other part of head, initial encounter: Secondary | ICD-10-CM | POA: Insufficient documentation

## 2019-03-15 DIAGNOSIS — Y9389 Activity, other specified: Secondary | ICD-10-CM | POA: Diagnosis not present

## 2019-03-15 DIAGNOSIS — Y999 Unspecified external cause status: Secondary | ICD-10-CM | POA: Insufficient documentation

## 2019-03-15 DIAGNOSIS — W19XXXA Unspecified fall, initial encounter: Secondary | ICD-10-CM

## 2019-03-15 DIAGNOSIS — Z7982 Long term (current) use of aspirin: Secondary | ICD-10-CM | POA: Insufficient documentation

## 2019-03-15 MED ORDER — LIDOCAINE-EPINEPHRINE (PF) 1 %-1:200000 IJ SOLN
10.0000 mL | Freq: Once | INTRAMUSCULAR | Status: AC
  Administered 2019-03-15: 10 mL
  Filled 2019-03-15: qty 30

## 2019-03-15 MED ORDER — CEPHALEXIN 500 MG PO CAPS: 500.0000 mg | ORAL_CAPSULE | Freq: Four times a day (QID) | ORAL | 0 refills | Status: DC

## 2019-03-15 MED ORDER — TETANUS-DIPHTH-ACELL PERTUSSIS 5-2.5-18.5 LF-MCG/0.5 IM SUSP
0.5000 mL | Freq: Once | INTRAMUSCULAR | Status: AC
  Administered 2019-03-15: 0.5 mL via INTRAMUSCULAR
  Filled 2019-03-15: qty 0.5

## 2019-03-15 MED ORDER — DOUBLE ANTIBIOTIC 500-10000 UNIT/GM EX OINT
TOPICAL_OINTMENT | Freq: Once | CUTANEOUS | Status: AC
  Administered 2019-03-15: 1 via TOPICAL
  Filled 2019-03-15: qty 1

## 2019-03-15 NOTE — Discharge Instructions (Signed)
Take Keflex as prescribed.  Clean wounds twice daily with water and soap.  Apply a small amount of antibiotic ointment and a clean dry dressing.  Sutures may be removed in 10 to 14 days.  You may return to the ER or have them removed by your primary care provider.  Watch for signs of infection including redness or discharge.  If you have any concerns for infection please return to the emergency room.  Your CT head was unchanged from 2016.  It did show possible normal pressure hydrocephalus.  You can follow-up with your PCP regarding this.

## 2019-03-15 NOTE — ED Triage Notes (Signed)
Pt tripped over some rocks falling against the house, abrasions noted to forehead area, right shoulder pain, laceration noted to left lower leg, bleeding controlled at present, bandage applied in triage,

## 2019-03-15 NOTE — ED Notes (Signed)
Non stick telfa applied to left leg and wrapped with kling.

## 2019-03-15 NOTE — ED Notes (Signed)
Patient transported to X-ray 

## 2019-03-15 NOTE — ED Provider Notes (Signed)
[facility] EMERGENCY DEPARTMENT Provider Note   CSN: [id number] Arrival date & time: 03/15/19  1708     History   Chief Complaint Chief Complaint  Patient presents with  . Fall    HPI [name] is a 68 y.o. male.     HPI  68 year old male, with a PMH of diabetes, HLD, presents status post fall.  Per patient he was moving some stones when he tripped and fell forward.  He states he cut his left lower leg on the stone.  He hit his head on a brick wall and his right shoulder on the wall.  He denies LOC.  He has a laceration to the left leg.  He is complaining of right shoulder pain.  Past Medical History:  Diagnosis Date  . Diabetes mellitus   . Hypercholesteremia   . Peripheral neuropathy   . Sleep apnea     Patient Active Problem List   Diagnosis Date Noted  . Cellulitis of left lower extremity 10/18/2016  . Diabetes mellitus 10/18/2016  . Hypercholesteremia 10/18/2016  . Sleep apnea 10/18/2016    Past Surgical History:  Procedure Laterality Date  . COLONOSCOPY N/A 02/26/2016   Procedure: COLONOSCOPY;  Surgeon: Daneil Dolin, MD;  Location: [facility];  Service: Endoscopy;  Laterality: N/A;  8:30 Am  . HIP SURGERY Right   . LEG SURGERY Left         Home Medications    Prior to Admission medications   Medication Sig Start Date End Date Taking? Authorizing Provider  aspirin EC 81 MG tablet Take 81 mg by mouth daily.    [provider]  cephALEXin (KEFLEX) 500 MG capsule Take 1 capsule (500 mg total) by mouth 4 (four) times daily. 03/15/19   Muslima Toppins S, PA-C  glipiZIDE (GLUCOTROL XL) 10 MG 24 hr tablet Take 10 mg by mouth 2 (two) times daily. 07/20/14   [provider]  lisinopril (PRINIVIL,ZESTRIL) 5 MG tablet Take 5 mg by mouth daily. 06/20/14   [provider]  metFORMIN (GLUCOPHAGE) 1000 MG tablet Take 1,000 mg by mouth 2 (two) times daily with a meal.    [provider]  Multiple Vitamins-Minerals  (MULTIVITAMIN ADULT PO) Take 1 tablet by mouth daily.    [provider]  pioglitazone (ACTOS) 30 MG tablet Take 30 mg by mouth daily.    [provider]  Probiotic Product (PROBIOTIC-10 PO) Take 1 packet by mouth daily.    [provider]  ranitidine (ZANTAC) 150 MG tablet Take 1 tablet by mouth daily. 07/26/17   [provider]  simvastatin (ZOCOR) 10 MG tablet Take 5 mg by mouth every evening.  06/20/14   [provider]  vitamin B-12 (CYANOCOBALAMIN) 500 MCG tablet Take 500 mcg by mouth daily.    [provider]    Family History Family History  Problem Relation Age of Onset  . Lung cancer Mother   . Hiatal hernia Mother   . Heart Problems Father        Pacemaker  . CAD Father   . Benign prostatic hyperplasia Father     Social History Social History   Tobacco Use  . Smoking status: Former Smoker    Types: Cigarettes  . Smokeless tobacco: Never Used  Substance Use Topics  . Alcohol use: No  . Drug use: No     Allergies   Fish allergy and Sulfamethoxazole-trimethoprim   Review of Systems Review of Systems  Constitutional: Negative for  chills and fever.  Respiratory: Negative for shortness of breath.   Cardiovascular: Negative for chest pain.  Gastrointestinal: Negative for abdominal pain, nausea and vomiting.  Musculoskeletal: Positive for arthralgias.  Skin: Positive for wound.     Physical Exam Updated Vital Signs BP 137/86 (BP Location: Right Arm)   Pulse 84   Temp 98.2 F (36.8 C) (Oral)   Resp 18   Ht 5\' 8"  (1.727 m)   Wt (!) 149.7 kg   SpO2 96%   BMI 50.18 kg/m   Physical Exam Vitals signs and nursing note reviewed.  Constitutional:      Appearance: He is well-developed.  HENT:     Head: Normocephalic.      Comments: Patient with abrasions noted to the forehead in the middle of forehead and above the left eye.  No lacerations noted Eyes:     Extraocular Movements: Extraocular movements  intact.     Conjunctiva/sclera: Conjunctivae normal.  Neck:     Musculoskeletal: Neck supple.  Cardiovascular:     Rate and Rhythm: Normal rate and regular rhythm.     Heart sounds: Normal heart sounds. No murmur.  Pulmonary:     Effort: Pulmonary effort is normal. No respiratory distress.     Breath sounds: Normal breath sounds. No wheezing or rales.  Abdominal:     General: Bowel sounds are normal. There is no distension.     Palpations: Abdomen is soft.     Tenderness: There is no abdominal tenderness.  Musculoskeletal: Normal range of motion.        General: No deformity.     Right shoulder: He exhibits tenderness (over the anterior shoulder). He exhibits normal range of motion, no deformity and normal strength.  Skin:    General: Skin is warm and dry.     Findings: No erythema or rash.          Comments: Patient with a laceration to the left lower leg on the anterior aspect of the proximal leg, approximately 4 cm long, bleeding controlled  Neurological:     Mental Status: He is alert and oriented to person, place, and time.  Psychiatric:        Behavior: Behavior normal.      ED Treatments / Results  Labs (all labs ordered are listed, but only abnormal results are displayed) Labs Reviewed - No data to display  EKG None  Radiology Dg Shoulder Right  Result Date: 03/15/2019 CLINICAL DATA:  68 year old male status post fall. EXAM: RIGHT SHOULDER - 2+ VIEW COMPARISON:  Chest radiographs 08/24/2017 and earlier. FINDINGS: Bone mineralization is within normal limits for age. No glenohumeral joint dislocation. Proximal right humerus intact. Mild for age degenerative changes at the right [facility] joint. Right clavicle and scapula appear intact. Negative visible right ribs and chest. IMPRESSION: No acute fracture or dislocation identified about the right shoulder. Electronically Signed   By: Genevie Ann M.D.   On: 03/15/2019 19:55   Dg Tibia/fibula Left  Result Date: 03/15/2019  CLINICAL DATA:  Fall EXAM: LEFT TIBIA AND FIBULA - 2 VIEW COMPARISON:  None. FINDINGS: Screws noted within the distal fibula. No acute bony abnormality. Specifically, no acute fracture, subluxation, or dislocation. Degenerative changes in the knee and ankle. IMPRESSION: No acute bony abnormality. Electronically Signed   By: Rolm Baptise M.D.   On: 03/15/2019 19:54   Ct Head Wo Contrast  Result Date: 03/15/2019 CLINICAL DATA:  Tripped over rocks EXAM: CT HEAD WITHOUT CONTRAST TECHNIQUE: Contiguous axial images  were obtained from the base of the skull through the vertex without intravenous contrast. COMPARISON:  Dec 16,2016 FINDINGS: Brain: No evidence of acute territorial infarction, hemorrhage, hydrocephalus,extra-axial collection or mass lesion/mass effect. There is dilatation the ventricles somewhat out of proportion to the sulci. Normal gray-white differentiation. Vascular: No hyperdense vessel or unexpected calcification. Skull: The skull is intact. No fracture or focal lesion identified. Sinuses/Orbits: The visualized paranasal sinuses and mastoid air cells are clear. The orbits and globes intact. Other: None IMPRESSION: No acute intracranial abnormality. Dilation of the ventricles as on the prior exam, somewhat out of for portion to the sulci which could be due to normal pressure hydrocephalus or cerebral atrophy. Electronically Signed   By: Prudencio Pair M.D.   On: 03/15/2019 20:42    Procedures .Marland KitchenLaceration Repair  Date/Time: 03/15/2019 9:59 PM Performed by: Etter Sjogren, PA-C Authorized by: Etter Sjogren, PA-C   Consent:    Consent obtained:  Verbal   Consent given by:  Patient   Risks discussed:  Infection, need for additional repair, pain, poor cosmetic result and poor wound healing   Alternatives discussed:  No treatment and delayed treatment Universal protocol:    Procedure explained and questions answered to patient or proxy's satisfaction: yes     Relevant documents  present and verified: yes     Test results available and properly labeled: yes     Imaging studies available: yes     Required blood products, implants, devices, and special equipment available: yes     Site/side marked: yes     Immediately prior to procedure, a time out was called: yes     Patient identity confirmed:  Verbally with patient Anesthesia (see MAR for exact dosages):    Anesthesia method:  Local infiltration Laceration details:    Location:  Leg   Leg location:  L lower leg   Length (cm):  5   Depth (mm):  10 Repair type:    Repair type:  Simple Pre-procedure details:    Preparation:  Imaging obtained to evaluate for foreign bodies Exploration:    Hemostasis achieved with:  Epinephrine   Wound exploration: wound explored through full range of motion and entire depth of wound probed and visualized     Wound extent: no foreign bodies/material noted, no underlying fracture noted and no vascular damage noted     Contaminated: no   Treatment:    Area cleansed with:  Betadine and saline   Amount of cleaning:  Standard   Irrigation solution:  Sterile saline   Irrigation volume:  750   Irrigation method:  Pressure wash   Visualized foreign bodies/material removed: no   Skin repair:    Repair method:  Sutures   Suture size:  4-0   Suture material:  Nylon   Suture technique:  Simple interrupted   Number of sutures:  6 Approximation:    Approximation:  Close Post-procedure details:    Dressing:  Antibiotic ointment and non-adherent dressing   Patient tolerance of procedure:  Tolerated well, no immediate complications   (including critical care time)  Medications Ordered in ED Medications  lidocaine-EPINEPHrine (XYLOCAINE-EPINEPHrine) 1 %-1:200000 (PF) injection 10 mL (10 mLs Infiltration Given 03/15/19 2050)  Tdap (BOOSTRIX) injection 0.5 mL (0.5 mLs Intramuscular Given 03/15/19 2038)  polymixin-bacitracin (POLYSPORIN) ointment (1 application Topical Given 03/15/19  2142)     Initial Impression / Assessment and Plan / ED Course  I have reviewed the triage vital signs and the nursing notes.  Pertinent labs & imaging results that were available during my care of the patient were reviewed by me and considered in my medical decision making (see chart for details).        Patient presents status post fall.  He describes the fall as mechanical fall.  He has abrasions to his forehead, left nose.  He is complaining of right shoulder pain.  He has a laceration to the left lower leg.  Patient is diabetic.  Wound cleaned but will place on prophylactic antibiotics.  Given strict instructions to watch for infection.  CT head shows possible normal pressure hydrocephalus.  I discussed this with the patient encouraged follow-up with PCP.  However this is unlikely acute.  X-ray shows no evidence of fractures.  Wounds closed with sutures.  He has remnants of discharge.   At this time there does not appear to be any evidence of an acute emergency medical condition and the patient appears stable for discharge with appropriate outpatient follow up.Diagnosis was discussed with patient who verbalizes understanding and is agreeable to discharge. Pt case discussed with Dr.Rancour who agrees with my plan.   Final Clinical Impressions(s) / ED Diagnoses   Final diagnoses:  Fall, initial encounter  Injury of head, initial encounter  Laceration of left lower extremity, initial encounter  Acute pain of right shoulder    ED Discharge Orders         Ordered    cephALEXin (KEFLEX) 500 MG capsule  4 times daily     03/15/19 2204           Etter Sjogren, [place] 03/16/19 0108    Ezequiel Essex, MD 03/16/19 [phone]

## 2019-03-15 NOTE — ED Notes (Signed)
Patient transported to CT 

## 2019-03-22 ENCOUNTER — Other Ambulatory Visit: Payer: Self-pay

## 2019-03-22 DIAGNOSIS — Z20822 Contact with and (suspected) exposure to covid-19: Secondary | ICD-10-CM

## 2019-03-23 LAB — NOVEL CORONAVIRUS, NAA: SARS-CoV-2, NAA: NOT DETECTED

## 2019-04-17 DIAGNOSIS — E119 Type 2 diabetes mellitus without complications: Secondary | ICD-10-CM | POA: Diagnosis not present

## 2019-04-17 DIAGNOSIS — I10 Essential (primary) hypertension: Secondary | ICD-10-CM | POA: Diagnosis not present

## 2019-04-17 DIAGNOSIS — E1129 Type 2 diabetes mellitus with other diabetic kidney complication: Secondary | ICD-10-CM | POA: Diagnosis not present

## 2019-04-17 DIAGNOSIS — Z6841 Body Mass Index (BMI) 40.0 and over, adult: Secondary | ICD-10-CM | POA: Diagnosis not present

## 2019-04-17 DIAGNOSIS — E1140 Type 2 diabetes mellitus with diabetic neuropathy, unspecified: Secondary | ICD-10-CM | POA: Diagnosis not present

## 2019-04-19 DIAGNOSIS — E1129 Type 2 diabetes mellitus with other diabetic kidney complication: Secondary | ICD-10-CM | POA: Diagnosis not present

## 2019-05-03 DIAGNOSIS — J209 Acute bronchitis, unspecified: Secondary | ICD-10-CM | POA: Diagnosis not present

## 2019-05-03 DIAGNOSIS — J22 Unspecified acute lower respiratory infection: Secondary | ICD-10-CM | POA: Diagnosis not present

## 2019-05-03 DIAGNOSIS — Z681 Body mass index (BMI) 19 or less, adult: Secondary | ICD-10-CM | POA: Diagnosis not present

## 2019-10-12 DIAGNOSIS — Z1389 Encounter for screening for other disorder: Secondary | ICD-10-CM | POA: Diagnosis not present

## 2019-10-12 DIAGNOSIS — Z6841 Body Mass Index (BMI) 40.0 and over, adult: Secondary | ICD-10-CM | POA: Diagnosis not present

## 2019-10-12 DIAGNOSIS — E1165 Type 2 diabetes mellitus with hyperglycemia: Secondary | ICD-10-CM | POA: Diagnosis not present

## 2019-10-12 DIAGNOSIS — Z0000 Encounter for general adult medical examination without abnormal findings: Secondary | ICD-10-CM | POA: Diagnosis not present

## 2019-10-12 DIAGNOSIS — E119 Type 2 diabetes mellitus without complications: Secondary | ICD-10-CM | POA: Diagnosis not present

## 2019-12-14 ENCOUNTER — Encounter (HOSPITAL_COMMUNITY): Payer: Self-pay | Admitting: *Deleted

## 2019-12-14 ENCOUNTER — Emergency Department (HOSPITAL_COMMUNITY)
Admission: EM | Admit: 2019-12-14 | Discharge: 2019-12-14 | Disposition: A | Discharge status: Home / Self Care | Payer: Medicare PPO | Source: Home / Self Care | Attending: Emergency Medicine | Admitting: Emergency Medicine

## 2019-12-14 ENCOUNTER — Emergency Department (HOSPITAL_COMMUNITY): Payer: Medicare PPO

## 2019-12-14 ENCOUNTER — Other Ambulatory Visit: Payer: Self-pay

## 2019-12-14 DIAGNOSIS — E785 Hyperlipidemia, unspecified: Secondary | ICD-10-CM | POA: Diagnosis present

## 2019-12-14 DIAGNOSIS — E7800 Pure hypercholesterolemia, unspecified: Secondary | ICD-10-CM | POA: Diagnosis present

## 2019-12-14 DIAGNOSIS — U071 COVID-19: Secondary | ICD-10-CM

## 2019-12-14 DIAGNOSIS — I471 Supraventricular tachycardia: Secondary | ICD-10-CM | POA: Diagnosis not present

## 2019-12-14 DIAGNOSIS — E875 Hyperkalemia: Secondary | ICD-10-CM | POA: Diagnosis present

## 2019-12-14 DIAGNOSIS — R0600 Dyspnea, unspecified: Secondary | ICD-10-CM | POA: Diagnosis not present

## 2019-12-14 DIAGNOSIS — Z882 Allergy status to sulfonamides status: Secondary | ICD-10-CM | POA: Diagnosis not present

## 2019-12-14 DIAGNOSIS — Z7984 Long term (current) use of oral hypoglycemic drugs: Secondary | ICD-10-CM | POA: Diagnosis not present

## 2019-12-14 DIAGNOSIS — J180 Bronchopneumonia, unspecified organism: Secondary | ICD-10-CM | POA: Diagnosis not present

## 2019-12-14 DIAGNOSIS — R531 Weakness: Secondary | ICD-10-CM

## 2019-12-14 DIAGNOSIS — J1282 Pneumonia due to coronavirus disease 2019: Secondary | ICD-10-CM | POA: Diagnosis present

## 2019-12-14 DIAGNOSIS — R0902 Hypoxemia: Secondary | ICD-10-CM | POA: Diagnosis not present

## 2019-12-14 DIAGNOSIS — Z6841 Body Mass Index (BMI) 40.0 and over, adult: Secondary | ICD-10-CM | POA: Diagnosis not present

## 2019-12-14 DIAGNOSIS — Z801 Family history of malignant neoplasm of trachea, bronchus and lung: Secondary | ICD-10-CM | POA: Diagnosis not present

## 2019-12-14 DIAGNOSIS — J9811 Atelectasis: Secondary | ICD-10-CM | POA: Diagnosis not present

## 2019-12-14 DIAGNOSIS — J9601 Acute respiratory failure with hypoxia: Secondary | ICD-10-CM | POA: Diagnosis present

## 2019-12-14 DIAGNOSIS — E1122 Type 2 diabetes mellitus with diabetic chronic kidney disease: Secondary | ICD-10-CM | POA: Diagnosis present

## 2019-12-14 DIAGNOSIS — Z79899 Other long term (current) drug therapy: Secondary | ICD-10-CM | POA: Diagnosis not present

## 2019-12-14 DIAGNOSIS — E860 Dehydration: Secondary | ICD-10-CM | POA: Diagnosis not present

## 2019-12-14 DIAGNOSIS — J90 Pleural effusion, not elsewhere classified: Secondary | ICD-10-CM | POA: Diagnosis not present

## 2019-12-14 DIAGNOSIS — N1831 Chronic kidney disease, stage 3a: Secondary | ICD-10-CM | POA: Diagnosis present

## 2019-12-14 DIAGNOSIS — R4182 Altered mental status, unspecified: Secondary | ICD-10-CM | POA: Diagnosis present

## 2019-12-14 DIAGNOSIS — I700 Atherosclerosis of aorta: Secondary | ICD-10-CM | POA: Diagnosis not present

## 2019-12-14 DIAGNOSIS — J439 Emphysema, unspecified: Secondary | ICD-10-CM | POA: Diagnosis not present

## 2019-12-14 DIAGNOSIS — E119 Type 2 diabetes mellitus without complications: Secondary | ICD-10-CM | POA: Insufficient documentation

## 2019-12-14 DIAGNOSIS — Z7982 Long term (current) use of aspirin: Secondary | ICD-10-CM | POA: Insufficient documentation

## 2019-12-14 DIAGNOSIS — Z886 Allergy status to analgesic agent status: Secondary | ICD-10-CM | POA: Diagnosis not present

## 2019-12-14 DIAGNOSIS — Z91013 Allergy to seafood: Secondary | ICD-10-CM | POA: Diagnosis not present

## 2019-12-14 DIAGNOSIS — N179 Acute kidney failure, unspecified: Secondary | ICD-10-CM | POA: Diagnosis present

## 2019-12-14 DIAGNOSIS — W19XXXA Unspecified fall, initial encounter: Secondary | ICD-10-CM

## 2019-12-14 DIAGNOSIS — I129 Hypertensive chronic kidney disease with stage 1 through stage 4 chronic kidney disease, or unspecified chronic kidney disease: Secondary | ICD-10-CM | POA: Diagnosis present

## 2019-12-14 DIAGNOSIS — Z8249 Family history of ischemic heart disease and other diseases of the circulatory system: Secondary | ICD-10-CM | POA: Diagnosis not present

## 2019-12-14 DIAGNOSIS — I10 Essential (primary) hypertension: Secondary | ICD-10-CM | POA: Diagnosis present

## 2019-12-14 DIAGNOSIS — J189 Pneumonia, unspecified organism: Secondary | ICD-10-CM | POA: Diagnosis not present

## 2019-12-14 DIAGNOSIS — Z743 Need for continuous supervision: Secondary | ICD-10-CM | POA: Diagnosis not present

## 2019-12-14 DIAGNOSIS — Z87891 Personal history of nicotine dependence: Secondary | ICD-10-CM | POA: Diagnosis not present

## 2019-12-14 DIAGNOSIS — E1165 Type 2 diabetes mellitus with hyperglycemia: Secondary | ICD-10-CM | POA: Diagnosis present

## 2019-12-14 DIAGNOSIS — R9431 Abnormal electrocardiogram [ECG] [EKG]: Secondary | ICD-10-CM | POA: Diagnosis not present

## 2019-12-14 DIAGNOSIS — E11649 Type 2 diabetes mellitus with hypoglycemia without coma: Secondary | ICD-10-CM | POA: Diagnosis not present

## 2019-12-14 DIAGNOSIS — R069 Unspecified abnormalities of breathing: Secondary | ICD-10-CM | POA: Diagnosis not present

## 2019-12-14 DIAGNOSIS — R0602 Shortness of breath: Secondary | ICD-10-CM | POA: Diagnosis not present

## 2019-12-14 LAB — COMPREHENSIVE METABOLIC PANEL
ALT: 57 U/L — ABNORMAL HIGH (ref 0–44)
AST: 56 U/L — ABNORMAL HIGH (ref 15–41)
Albumin: 3.6 g/dL (ref 3.5–5.0)
Alkaline Phosphatase: 74 U/L (ref 38–126)
Anion gap: 11 (ref 5–15)
BUN: 22 mg/dL (ref 8–23)
CO2: 26 mmol/L (ref 22–32)
Calcium: 9 mg/dL (ref 8.9–10.3)
Chloride: 98 mmol/L (ref 98–111)
Creatinine, Ser: 1.45 mg/dL — ABNORMAL HIGH (ref 0.61–1.24)
GFR calc Af Amer: 56 mL/min — ABNORMAL LOW (ref >60)
GFR calc non Af Amer: 48 mL/min — ABNORMAL LOW (ref >60)
Glucose, Bld: 195 mg/dL — ABNORMAL HIGH (ref 70–99)
Potassium: 4.4 mmol/L (ref 3.5–5.1)
Sodium: 135 mmol/L (ref 135–145)
Total Bilirubin: 1 mg/dL (ref 0.3–1.2)
Total Protein: 7.5 g/dL (ref 6.5–8.1)

## 2019-12-14 LAB — URINALYSIS, ROUTINE W REFLEX MICROSCOPIC
Bacteria, UA: NONE SEEN
Bilirubin Urine: NEGATIVE
Glucose, UA: 50 mg/dL — AB
Hgb urine dipstick: NEGATIVE
Ketones, ur: 20 mg/dL — AB
Leukocytes,Ua: NEGATIVE
Nitrite: NEGATIVE
Protein, ur: 30 mg/dL — AB
Specific Gravity, Urine: 1.023 (ref 1.005–1.030)
pH: 5 (ref 5.0–8.0)

## 2019-12-14 LAB — CBC WITH DIFFERENTIAL/PLATELET
Abs Immature Granulocytes: 0.02 10*3/uL (ref 0.00–0.07)
Basophils Absolute: 0 10*3/uL (ref 0.0–0.1)
Basophils Relative: 0 %
Eosinophils Absolute: 0 10*3/uL (ref 0.0–0.5)
Eosinophils Relative: 0 %
HCT: 48.7 % (ref 39.0–52.0)
Hemoglobin: 15.9 g/dL (ref 13.0–17.0)
Immature Granulocytes: 0 %
Lymphocytes Relative: 8 %
Lymphs Abs: 0.7 10*3/uL (ref 0.7–4.0)
MCH: 29.8 pg (ref 26.0–34.0)
MCHC: 32.6 g/dL (ref 30.0–36.0)
MCV: 91.2 fL (ref 80.0–100.0)
Monocytes Absolute: 0.7 10*3/uL (ref 0.1–1.0)
Monocytes Relative: 8 %
Neutro Abs: 7.2 10*3/uL (ref 1.7–7.7)
Neutrophils Relative %: 84 %
Platelets: 110 10*3/uL — ABNORMAL LOW (ref 150–400)
RBC: 5.34 MIL/uL (ref 4.22–5.81)
RDW: 13.5 % (ref 11.5–15.5)
WBC: 8.6 10*3/uL (ref 4.0–10.5)
nRBC: 0 % (ref 0.0–0.2)

## 2019-12-14 LAB — SARS CORONAVIRUS 2 BY RT PCR (HOSPITAL ORDER, PERFORMED IN ~~LOC~~ HOSPITAL LAB): SARS Coronavirus 2: POSITIVE — AB

## 2019-12-14 MED ORDER — LACTATED RINGERS IV BOLUS
500.0000 mL | Freq: Once | INTRAVENOUS | Status: AC
  Administered 2019-12-14: 500 mL via INTRAVENOUS

## 2019-12-14 MED ORDER — LACTATED RINGERS IV BOLUS
1000.0000 mL | Freq: Once | INTRAVENOUS | Status: AC
  Administered 2019-12-14: 1000 mL via INTRAVENOUS

## 2019-12-14 NOTE — ED Triage Notes (Signed)
Pt states he got up to get out of bed and he just felt weak and could not stand up. States he has not felt well for 3 days. Vaccinated Moderna in February.

## 2019-12-14 NOTE — ED Triage Notes (Signed)
Pt arrives via [facility] from home, called out for a fall, laying on hallway floor, said his "knees gave out". He has been feling bad x 3 days, cough, generalized weakness. Wife is covid +, he was tested yesterday, no results. cbg 198, 173/70's. Pulse ox 92% RA.

## 2019-12-14 NOTE — ED Provider Notes (Signed)
[facility] EMERGENCY DEPARTMENT Provider Note   CSN: [id number] Arrival date & time: 12/14/19  0359     History Chief Complaint  Patient presents with  . Fall    Arman Loy is a 69 y.o. male.  Wife has Covid and patient has been weak and feeling bad for the last few days mostly bedbound.  Today he was walking to the hall and he had generalized weakness mostly in his to lower legs he had a sit down on the ground.  He did not fall or hit his head or having traumatic injuries just the weakness in his legs.  No weakness in his arms.  No headache or syncope.  No other chest pain, abdominal pain back pain or other associated symptoms.  The history is provided by the patient.  Fall This is a new problem. The current episode started 1 to 2 hours ago. The problem occurs constantly. The problem has not changed since onset.Associated symptoms include headaches and shortness of breath. Pertinent negatives include no chest pain and no abdominal pain. Nothing aggravates the symptoms. Nothing relieves the symptoms. He has tried nothing for the symptoms. The treatment provided no relief.  Weakness Associated symptoms: cough, headaches and shortness of breath   Associated symptoms: no abdominal pain and no chest pain        Past Medical History:  Diagnosis Date  . Diabetes mellitus   . Hypercholesteremia   . Peripheral neuropathy   . Sleep apnea     Patient Active Problem List   Diagnosis Date Noted  . Cellulitis of left lower extremity 10/18/2016  . Diabetes mellitus 10/18/2016  . Hypercholesteremia 10/18/2016  . Sleep apnea 10/18/2016    Past Surgical History:  Procedure Laterality Date  . COLONOSCOPY N/A 02/26/2016   Procedure: COLONOSCOPY;  Surgeon: Daneil Dolin, MD;  Location: [facility];  Service: Endoscopy;  Laterality: N/A;  8:30 Am  . HIP SURGERY Right   . LEG SURGERY Left        Family History  Problem Relation Age of Onset  . Lung cancer Mother   . Hiatal  hernia Mother   . Heart Problems Father        Pacemaker  . CAD Father   . Benign prostatic hyperplasia Father     Social History   Tobacco Use  . Smoking status: Former Smoker    Types: Cigarettes  . Smokeless tobacco: Never Used  Substance Use Topics  . Alcohol use: No  . Drug use: No    Home Medications Prior to Admission medications   Medication Sig Start Date End Date Taking? Authorizing Provider  aspirin EC 81 MG tablet Take 81 mg by mouth daily.    [provider]  cephALEXin (KEFLEX) 500 MG capsule Take 1 capsule (500 mg total) by mouth 4 (four) times daily. 03/15/19   Kendrick, Caitlyn S, PA-C  glipiZIDE (GLUCOTROL XL) 10 MG 24 hr tablet Take 10 mg by mouth 2 (two) times daily. 07/20/14   [provider]  lisinopril (PRINIVIL,ZESTRIL) 5 MG tablet Take 5 mg by mouth daily. 06/20/14   [provider]  metFORMIN (GLUCOPHAGE) 1000 MG tablet Take 1,000 mg by mouth 2 (two) times daily with a meal.    [provider]  Multiple Vitamins-Minerals (MULTIVITAMIN ADULT PO) Take 1 tablet by mouth daily.    [provider]  pioglitazone (ACTOS) 30 MG tablet Take 30 mg by mouth daily.    [provider]  Probiotic Product (  PROBIOTIC-10 PO) Take 1 packet by mouth daily.    [provider]  ranitidine (ZANTAC) 150 MG tablet Take 1 tablet by mouth daily. 07/26/17   [provider]  simvastatin (ZOCOR) 10 MG tablet Take 5 mg by mouth every evening.  06/20/14   [provider]  vitamin B-12 (CYANOCOBALAMIN) 500 MCG tablet Take 500 mcg by mouth daily.    [provider]    Allergies    Fish allergy and Sulfamethoxazole-trimethoprim  Review of Systems   Review of Systems  Respiratory: Positive for cough and shortness of breath.   Cardiovascular: Negative for chest pain.  Gastrointestinal: Negative for abdominal pain.  Neurological: Positive for weakness and headaches.  All other systems reviewed and are  negative.   Physical Exam Updated Vital Signs BP 137/81   Pulse 90   Temp 98.8 F (37.1 C) (Oral)   Resp 19   SpO2 94%   Physical Exam Vitals and nursing note reviewed.  Constitutional:      Appearance: He is well-developed.  HENT:     Head: Normocephalic and atraumatic.     Mouth/Throat:     Pharynx: Oropharynx is clear.  Eyes:     Pupils: Pupils are equal, round, and reactive to light.  Cardiovascular:     Rate and Rhythm: Normal rate.  Pulmonary:     Effort: Pulmonary effort is normal. No respiratory distress.  Abdominal:     General: There is no distension.  Musculoskeletal:        General: Normal range of motion.     Cervical back: Normal range of motion.  Skin:    General: Skin is warm and dry.  Neurological:     General: No focal deficit present.     Mental Status: He is alert.     Comments: No altered mental status, able to give full seemingly accurate history.  Face is symmetric, EOM's intact, pupils equal and reactive, vision intact, tongue and uvula midline without deviation. Upper and Lower extremity motor 5/5, intact pain perception in distal extremities, 2+ reflexes in biceps, patella and achilles tendons. Able to perform finger to nose normal with both hands.      ED Results / Procedures / Treatments   Labs (all labs ordered are listed, but only abnormal results are displayed) Labs Reviewed  SARS CORONAVIRUS 2 BY RT PCR ([facility]) - Abnormal; Notable for the following components:      Result Value   SARS Coronavirus 2 POSITIVE (*)    All other components within normal limits  CBC WITH DIFFERENTIAL/PLATELET - Abnormal; Notable for the following components:   Platelets 110 (*)    All other components within normal limits  COMPREHENSIVE METABOLIC PANEL - Abnormal; Notable for the following components:   Glucose, Bld 195 (*)    Creatinine, Ser 1.45 (*)    AST 56 (*)    ALT 57 (*)    GFR calc non Af Amer  48 (*)    GFR calc Af Amer 56 (*)    All other components within normal limits  URINALYSIS, ROUTINE W REFLEX MICROSCOPIC    EKG EKG Interpretation  Date/Time:  Thursday December 14 2019 05:18:18 EDT Ventricular Rate:  89 PR Interval:    QRS Duration: 93 QT Interval:  355 QTC Calculation: 432 R Axis:   -22 Text Interpretation: Sinus rhythm Atrial premature complexes Prolonged PR interval Borderline left axis deviation Low voltage, precordial leads Consider anterior infarct No  significant change since last tracing Confirmed by Merrily Pew [phone]) on 12/14/2019 5:28:48 AM   Radiology DG Chest Portable 1 View  Result Date: 12/14/2019 CLINICAL DATA:  Patient collapsed.  Wife is COVID-19 positive. EXAM: PORTABLE CHEST 1 VIEW COMPARISON:  08/24/2017. FINDINGS: Mediastinum and hilar structures normal. Heart size normal. Low lung volumes. Mild bibasilar and or scarring again noted. Mild left base infiltrate cannot be excluded. Small left pleural effusion cannot be excluded. No pneumothorax. Degenerative change thoracic spine. IMPRESSION: Mild bibasilar atelectasis and or scarring again noted. Mild left base infiltrate cannot be excluded. Small left pleural effusion cannot be excluded. Electronically Signed   By: Marcello Moores  Register   On: 12/14/2019 05:26    Procedures Procedures (including critical care time)  Medications Ordered in ED Medications  lactated ringers bolus 1,000 mL (0 mLs Intravenous Stopped 12/14/19 0623)  lactated ringers bolus 500 mL (500 mLs Intravenous New Bag/Given 12/14/19 5366)    ED Course  I have reviewed the triage vital signs and the nursing notes.  Pertinent labs & imaging results that were available during my care of the patient were reviewed by me and considered in my medical decision making (see chart for details).    MDM Rules/Calculators/A&P                          Likely dehydration, possibly from COVID, will check for both. Tolerating PO and is thirsty,  will provide fluids. Otherwise will give IVF and reeval.  On reeval patient feels much better with PO and IV fluids. No urine yet, will give fluids until UA but ultimately will likely be discharged.  Patient with significatn improvement with IVF. Stable for discharge.   Final Clinical Impression(s) / ED Diagnoses Final diagnoses:  Fall, initial encounter  Weakness  Dehydration  COVID-19    Rx / DC Orders ED Discharge Orders    None       Renezmae Canlas, Corene Cornea, MD 12/15/19 [phone]

## 2019-12-14 NOTE — ED Notes (Signed)
Attempted to call spouse for transportation home; no answer and vm full-unable to leave message

## 2019-12-17 ENCOUNTER — Emergency Department (HOSPITAL_COMMUNITY): Payer: Medicare PPO

## 2019-12-17 ENCOUNTER — Other Ambulatory Visit: Payer: Self-pay

## 2019-12-17 ENCOUNTER — Inpatient Hospital Stay (HOSPITAL_COMMUNITY)
Admission: EM | Admit: 2019-12-17 | Discharge: 2019-12-25 | DRG: 177 | Disposition: A | Discharge status: Home Health | Payer: Medicare PPO | Attending: Internal Medicine | Admitting: Internal Medicine

## 2019-12-17 ENCOUNTER — Encounter (HOSPITAL_COMMUNITY): Payer: Self-pay

## 2019-12-17 DIAGNOSIS — E1165 Type 2 diabetes mellitus with hyperglycemia: Secondary | ICD-10-CM | POA: Diagnosis present

## 2019-12-17 DIAGNOSIS — I471 Supraventricular tachycardia, unspecified: Secondary | ICD-10-CM

## 2019-12-17 DIAGNOSIS — N179 Acute kidney failure, unspecified: Secondary | ICD-10-CM | POA: Diagnosis present

## 2019-12-17 DIAGNOSIS — J1282 Pneumonia due to coronavirus disease 2019: Secondary | ICD-10-CM | POA: Diagnosis present

## 2019-12-17 DIAGNOSIS — J9601 Acute respiratory failure with hypoxia: Secondary | ICD-10-CM | POA: Diagnosis present

## 2019-12-17 DIAGNOSIS — Z79899 Other long term (current) drug therapy: Secondary | ICD-10-CM

## 2019-12-17 DIAGNOSIS — Z882 Allergy status to sulfonamides status: Secondary | ICD-10-CM

## 2019-12-17 DIAGNOSIS — E785 Hyperlipidemia, unspecified: Secondary | ICD-10-CM | POA: Diagnosis present

## 2019-12-17 DIAGNOSIS — I129 Hypertensive chronic kidney disease with stage 1 through stage 4 chronic kidney disease, or unspecified chronic kidney disease: Secondary | ICD-10-CM | POA: Diagnosis present

## 2019-12-17 DIAGNOSIS — Z7984 Long term (current) use of oral hypoglycemic drugs: Secondary | ICD-10-CM | POA: Diagnosis not present

## 2019-12-17 DIAGNOSIS — N1831 Chronic kidney disease, stage 3a: Secondary | ICD-10-CM | POA: Diagnosis present

## 2019-12-17 DIAGNOSIS — I10 Essential (primary) hypertension: Secondary | ICD-10-CM | POA: Diagnosis present

## 2019-12-17 DIAGNOSIS — Z87891 Personal history of nicotine dependence: Secondary | ICD-10-CM

## 2019-12-17 DIAGNOSIS — Z8249 Family history of ischemic heart disease and other diseases of the circulatory system: Secondary | ICD-10-CM

## 2019-12-17 DIAGNOSIS — E1122 Type 2 diabetes mellitus with diabetic chronic kidney disease: Secondary | ICD-10-CM | POA: Diagnosis present

## 2019-12-17 DIAGNOSIS — E7800 Pure hypercholesterolemia, unspecified: Secondary | ICD-10-CM | POA: Diagnosis present

## 2019-12-17 DIAGNOSIS — E875 Hyperkalemia: Secondary | ICD-10-CM | POA: Diagnosis present

## 2019-12-17 DIAGNOSIS — Z886 Allergy status to analgesic agent status: Secondary | ICD-10-CM

## 2019-12-17 DIAGNOSIS — Z91013 Allergy to seafood: Secondary | ICD-10-CM | POA: Diagnosis not present

## 2019-12-17 DIAGNOSIS — Z6841 Body Mass Index (BMI) 40.0 and over, adult: Secondary | ICD-10-CM

## 2019-12-17 DIAGNOSIS — Z801 Family history of malignant neoplasm of trachea, bronchus and lung: Secondary | ICD-10-CM | POA: Diagnosis not present

## 2019-12-17 DIAGNOSIS — U071 COVID-19: Secondary | ICD-10-CM | POA: Diagnosis present

## 2019-12-17 DIAGNOSIS — R4182 Altered mental status, unspecified: Secondary | ICD-10-CM | POA: Diagnosis present

## 2019-12-17 DIAGNOSIS — E11649 Type 2 diabetes mellitus with hypoglycemia without coma: Secondary | ICD-10-CM | POA: Diagnosis not present

## 2019-12-17 DIAGNOSIS — IMO0002 Reserved for concepts with insufficient information to code with codable children: Secondary | ICD-10-CM | POA: Diagnosis present

## 2019-12-17 LAB — COMPREHENSIVE METABOLIC PANEL
ALT: 42 U/L (ref 0–44)
AST: 50 U/L — ABNORMAL HIGH (ref 15–41)
Albumin: 3 g/dL — ABNORMAL LOW (ref 3.5–5.0)
Alkaline Phosphatase: 62 U/L (ref 38–126)
Anion gap: 13 (ref 5–15)
BUN: 34 mg/dL — ABNORMAL HIGH (ref 8–23)
CO2: 23 mmol/L (ref 22–32)
Calcium: 8.3 mg/dL — ABNORMAL LOW (ref 8.9–10.3)
Chloride: 96 mmol/L — ABNORMAL LOW (ref 98–111)
Creatinine, Ser: 1.67 mg/dL — ABNORMAL HIGH (ref 0.61–1.24)
GFR calc Af Amer: 47 mL/min — ABNORMAL LOW (ref >60)
GFR calc non Af Amer: 41 mL/min — ABNORMAL LOW (ref >60)
Glucose, Bld: 265 mg/dL — ABNORMAL HIGH (ref 70–99)
Potassium: 4.2 mmol/L (ref 3.5–5.1)
Sodium: 132 mmol/L — ABNORMAL LOW (ref 135–145)
Total Bilirubin: 1.3 mg/dL — ABNORMAL HIGH (ref 0.3–1.2)
Total Protein: 7 g/dL (ref 6.5–8.1)

## 2019-12-17 LAB — CBC WITH DIFFERENTIAL/PLATELET
Abs Immature Granulocytes: 0.04 10*3/uL (ref 0.00–0.07)
Basophils Absolute: 0 10*3/uL (ref 0.0–0.1)
Basophils Relative: 0 %
Eosinophils Absolute: 0 10*3/uL (ref 0.0–0.5)
Eosinophils Relative: 0 %
HCT: 46.6 % (ref 39.0–52.0)
Hemoglobin: 15.2 g/dL (ref 13.0–17.0)
Immature Granulocytes: 1 %
Lymphocytes Relative: 9 %
Lymphs Abs: 0.6 10*3/uL — ABNORMAL LOW (ref 0.7–4.0)
MCH: 29.2 pg (ref 26.0–34.0)
MCHC: 32.6 g/dL (ref 30.0–36.0)
MCV: 89.4 fL (ref 80.0–100.0)
Monocytes Absolute: 0.4 10*3/uL (ref 0.1–1.0)
Monocytes Relative: 6 %
Neutro Abs: 6.2 10*3/uL (ref 1.7–7.7)
Neutrophils Relative %: 84 %
Platelets: 117 10*3/uL — ABNORMAL LOW (ref 150–400)
RBC: 5.21 MIL/uL (ref 4.22–5.81)
RDW: 13.5 % (ref 11.5–15.5)
WBC: 7.3 10*3/uL (ref 4.0–10.5)
nRBC: 0 % (ref 0.0–0.2)

## 2019-12-17 LAB — TRIGLYCERIDES: Triglycerides: 58 mg/dL (ref <150)

## 2019-12-17 LAB — LACTATE DEHYDROGENASE: LDH: 361 U/L — ABNORMAL HIGH (ref 98–192)

## 2019-12-17 LAB — C-REACTIVE PROTEIN: CRP: 15 mg/dL — ABNORMAL HIGH (ref <1.0)

## 2019-12-17 LAB — D-DIMER, QUANTITATIVE: D-Dimer, Quant: 0.69 ug/mL-FEU — ABNORMAL HIGH (ref 0.00–0.50)

## 2019-12-17 LAB — FIBRINOGEN: Fibrinogen: 196 mg/dL — ABNORMAL LOW (ref 210–475)

## 2019-12-17 LAB — LACTIC ACID, PLASMA
Lactic Acid, Venous: 2 mmol/L (ref 0.5–1.9)
Lactic Acid, Venous: 1.7 mmol/L (ref 0.5–1.9)

## 2019-12-17 LAB — FERRITIN: Ferritin: 530 ng/mL — ABNORMAL HIGH (ref 24–336)

## 2019-12-17 LAB — PROCALCITONIN: Procalcitonin: 0.72 ng/mL

## 2019-12-17 LAB — CBG MONITORING, ED: Glucose-Capillary: 286 mg/dL — ABNORMAL HIGH (ref 70–99)

## 2019-12-17 MED ORDER — LINAGLIPTIN 5 MG PO TABS
5.0000 mg | ORAL_TABLET | Freq: Every day | ORAL | Status: DC
  Administered 2019-12-18 – 2019-12-25 (×8): 5 mg via ORAL
  Filled 2019-12-17 (×11): qty 1

## 2019-12-17 MED ORDER — ALBUTEROL SULFATE HFA 108 (90 BASE) MCG/ACT IN AERS
2.0000 | INHALATION_SPRAY | Freq: Four times a day (QID) | RESPIRATORY_TRACT | Status: DC
  Administered 2019-12-18 (×2): 2 via RESPIRATORY_TRACT

## 2019-12-17 MED ORDER — PANTOPRAZOLE SODIUM 40 MG PO TBEC
40.0000 mg | DELAYED_RELEASE_TABLET | Freq: Every day | ORAL | Status: DC
  Administered 2019-12-18 – 2019-12-25 (×8): 40 mg via ORAL
  Filled 2019-12-17 (×8): qty 1

## 2019-12-17 MED ORDER — ACETAMINOPHEN 325 MG PO TABS: 650.0000 mg | ORAL_TABLET | Freq: Four times a day (QID) | ORAL | Status: DC | PRN

## 2019-12-17 MED ORDER — INSULIN ASPART 100 UNIT/ML ~~LOC~~ SOLN
0.0000 [IU] | Freq: Every day | SUBCUTANEOUS | Status: DC
  Administered 2019-12-17: 3 [IU] via SUBCUTANEOUS
  Administered 2019-12-18: 4 [IU] via SUBCUTANEOUS
  Administered 2019-12-19: 2 [IU] via SUBCUTANEOUS
  Administered 2019-12-20: 4 [IU] via SUBCUTANEOUS
  Administered 2019-12-21 – 2019-12-22 (×2): 2 [IU] via SUBCUTANEOUS
  Administered 2019-12-23: 4 [IU] via SUBCUTANEOUS
  Filled 2019-12-17: qty 1

## 2019-12-17 MED ORDER — ALBUTEROL SULFATE HFA 108 (90 BASE) MCG/ACT IN AERS
2.0000 | INHALATION_SPRAY | Freq: Four times a day (QID) | RESPIRATORY_TRACT | Status: DC
  Administered 2019-12-17: 2 via RESPIRATORY_TRACT
  Filled 2019-12-17: qty 6.7

## 2019-12-17 MED ORDER — ONDANSETRON HCL 4 MG PO TABS
4.0000 mg | ORAL_TABLET | Freq: Four times a day (QID) | ORAL | Status: DC | PRN
  Filled 2019-12-17: qty 1

## 2019-12-17 MED ORDER — ASPIRIN EC 81 MG PO TBEC
81.0000 mg | DELAYED_RELEASE_TABLET | Freq: Every day | ORAL | Status: DC
  Administered 2019-12-18 – 2019-12-25 (×8): 81 mg via ORAL
  Filled 2019-12-17 (×8): qty 1

## 2019-12-17 MED ORDER — ZINC SULFATE 220 (50 ZN) MG PO CAPS
220.0000 mg | ORAL_CAPSULE | Freq: Every day | ORAL | Status: DC
  Administered 2019-12-18 – 2019-12-25 (×8): 220 mg via ORAL
  Filled 2019-12-17 (×8): qty 1

## 2019-12-17 MED ORDER — ASCORBIC ACID 500 MG PO TABS
500.0000 mg | ORAL_TABLET | Freq: Every day | ORAL | Status: DC
  Administered 2019-12-18 – 2019-12-25 (×8): 500 mg via ORAL
  Filled 2019-12-17 (×8): qty 1

## 2019-12-17 MED ORDER — SODIUM CHLORIDE 0.9 % IV SOLN
100.0000 mg | Freq: Every day | INTRAVENOUS | Status: AC
  Administered 2019-12-18 – 2019-12-21 (×4): 100 mg via INTRAVENOUS
  Filled 2019-12-17 (×3): qty 20

## 2019-12-17 MED ORDER — GUAIFENESIN-DM 100-10 MG/5ML PO SYRP
10.0000 mL | ORAL_SOLUTION | ORAL | Status: DC | PRN
  Administered 2019-12-19 – 2019-12-24 (×4): 10 mL via ORAL
  Filled 2019-12-17 (×5): qty 10

## 2019-12-17 MED ORDER — PREDNISONE 20 MG PO TABS: 50.0000 mg | ORAL_TABLET | Freq: Every day | ORAL | Status: DC

## 2019-12-17 MED ORDER — VITAMIN B-12 1000 MCG PO TABS
500.0000 ug | ORAL_TABLET | Freq: Every day | ORAL | Status: DC
  Administered 2019-12-18 – 2019-12-25 (×8): 500 ug via ORAL
  Filled 2019-12-17: qty 1
  Filled 2019-12-17: qty 5
  Filled 2019-12-17 (×4): qty 1
  Filled 2019-12-17: qty 5

## 2019-12-17 MED ORDER — SODIUM CHLORIDE 0.9 % IV SOLN
100.0000 mg | Freq: Once | INTRAVENOUS | Status: AC
  Administered 2019-12-17: 100 mg via INTRAVENOUS
  Filled 2019-12-17: qty 20

## 2019-12-17 MED ORDER — DEXAMETHASONE SODIUM PHOSPHATE 10 MG/ML IJ SOLN
10.0000 mg | Freq: Once | INTRAMUSCULAR | Status: AC
  Administered 2019-12-17: 10 mg via INTRAVENOUS
  Filled 2019-12-17: qty 1

## 2019-12-17 MED ORDER — ONDANSETRON HCL 4 MG/2ML IJ SOLN: 4.0000 mg | Freq: Four times a day (QID) | INTRAMUSCULAR | Status: DC | PRN

## 2019-12-17 MED ORDER — SODIUM CHLORIDE 0.9 % IV SOLN
500.0000 mg | INTRAVENOUS | Status: AC
  Administered 2019-12-17 – 2019-12-18 (×2): 500 mg via INTRAVENOUS
  Filled 2019-12-17 (×2): qty 500

## 2019-12-17 MED ORDER — INSULIN ASPART 100 UNIT/ML ~~LOC~~ SOLN
0.0000 [IU] | Freq: Three times a day (TID) | SUBCUTANEOUS | Status: DC
  Administered 2019-12-18: 20 [IU] via SUBCUTANEOUS
  Administered 2019-12-18 – 2019-12-19 (×2): 15 [IU] via SUBCUTANEOUS
  Administered 2019-12-19 – 2019-12-20 (×3): 20 [IU] via SUBCUTANEOUS
  Administered 2019-12-21: 11 [IU] via SUBCUTANEOUS
  Administered 2019-12-21: 20 [IU] via SUBCUTANEOUS
  Administered 2019-12-22 (×2): 15 [IU] via SUBCUTANEOUS
  Administered 2019-12-23: 20 [IU] via SUBCUTANEOUS
  Administered 2019-12-23: 15 [IU] via SUBCUTANEOUS
  Administered 2019-12-24: 4 [IU] via SUBCUTANEOUS
  Administered 2019-12-24: 15 [IU] via SUBCUTANEOUS
  Administered 2019-12-24 – 2019-12-25 (×2): 7 [IU] via SUBCUTANEOUS
  Filled 2019-12-17: qty 1

## 2019-12-17 MED ORDER — ENOXAPARIN SODIUM 80 MG/0.8ML ~~LOC~~ SOLN
0.5000 mg/kg | SUBCUTANEOUS | Status: DC
  Administered 2019-12-17 – 2019-12-24 (×8): 75 mg via SUBCUTANEOUS
  Filled 2019-12-17 (×8): qty 0.8

## 2019-12-17 MED ORDER — SODIUM CHLORIDE 0.9 % IV SOLN: 1.0000 g | INTRAVENOUS | Status: DC

## 2019-12-17 MED ORDER — SODIUM CHLORIDE 0.9 % IV SOLN: INTRAVENOUS | Status: AC

## 2019-12-17 MED ORDER — HYDROCOD POLST-CPM POLST ER 10-8 MG/5ML PO SUER
5.0000 mL | Freq: Two times a day (BID) | ORAL | Status: DC | PRN
  Administered 2019-12-19 (×2): 5 mL via ORAL
  Filled 2019-12-17 (×2): qty 5

## 2019-12-17 MED ORDER — METHYLPREDNISOLONE SODIUM SUCC 125 MG IJ SOLR
0.5000 mg/kg | Freq: Two times a day (BID) | INTRAMUSCULAR | Status: DC
  Administered 2019-12-17 – 2019-12-20 (×6): 75 mg via INTRAVENOUS
  Filled 2019-12-17 (×6): qty 2

## 2019-12-17 MED ORDER — SODIUM CHLORIDE 0.9 % IV SOLN: 200.0000 mg | Freq: Once | INTRAVENOUS | Status: DC

## 2019-12-17 MED ORDER — SODIUM CHLORIDE 0.9 % IV SOLN
2.0000 g | INTRAVENOUS | Status: AC
  Administered 2019-12-17 – 2019-12-18 (×2): 2 g via INTRAVENOUS
  Filled 2019-12-17 (×2): qty 20

## 2019-12-17 MED ORDER — SODIUM CHLORIDE 0.9 % IV SOLN: 100.0000 mg | Freq: Every day | INTRAVENOUS | Status: DC

## 2019-12-17 NOTE — ED Provider Notes (Addendum)
[facility] Emergency Department Provider Note MRN:  [id number]  Arrival date & time: 12/17/19     Chief Complaint   Shortness of Breath and Covid Positive   History of Present Illness   [name] is a 69 y.o. year-old male with a history of diabetes presenting to the ED with chief complaint of shortness of breath.  Patient was diagnosed with Covid 3 days ago, he has felt some general weakness but otherwise symptoms are mild.  Here today because of low oxygen numbers at home.  Does not feel particularly short of breath, no chest pain, no abdominal pain, no vomiting or diarrhea, no complaints at this time.  Review of Systems  A complete 10 system review of systems was obtained and all systems are negative except as noted in the HPI and PMH.   Patient's Health History    Past Medical History:  Diagnosis Date  . Diabetes mellitus   . Hypercholesteremia   . Peripheral neuropathy   . Sleep apnea     Past Surgical History:  Procedure Laterality Date  . COLONOSCOPY N/A 02/26/2016   Procedure: COLONOSCOPY;  Surgeon: Daneil Dolin, MD;  Location: [facility];  Service: Endoscopy;  Laterality: N/A;  8:30 Am  . HIP SURGERY Right   . LEG SURGERY Left     Family History  Problem Relation Age of Onset  . Lung cancer Mother   . Hiatal hernia Mother   . Heart Problems Father        Pacemaker  . CAD Father   . Benign prostatic hyperplasia Father     Social History   Socioeconomic History  . Marital status: Married    Spouse name: Not on file  . Number of children: Not on file  . Years of education: Not on file  . Highest education level: Not on file  Occupational History  . Not on file  Tobacco Use  . Smoking status: Former Smoker    Types: Cigarettes  . Smokeless tobacco: Never Used  Substance and Sexual Activity  . Alcohol use: No  . Drug use: No  . Sexual activity: Not on file  Other Topics Concern  . Not on file  Social History  Narrative  . Not on file   Social Determinants of Health   Financial Resource Strain:   . Difficulty of Paying Living Expenses: Not on file  Food Insecurity:   . Worried About Charity fundraiser in the Last Year: Not on file  . Ran Out of Food in the Last Year: Not on file  Transportation Needs:   . Lack of Transportation (Medical): Not on file  . Lack of Transportation (Non-Medical): Not on file  Physical Activity:   . Days of Exercise per Week: Not on file  . Minutes of Exercise per Session: Not on file  Stress:   . Feeling of Stress : Not on file  Social Connections:   . Frequency of Communication with Friends and Family: Not on file  . Frequency of Social Gatherings with Friends and Family: Not on file  . Attends Religious Services: Not on file  . Active Member of Clubs or Organizations: Not on file  . Attends Archivist Meetings: Not on file  . Marital Status: Not on file  Intimate Partner Violence:   . Fear of Current or Ex-Partner: Not on file  . Emotionally Abused: Not on file  . Physically Abused: Not on file  . Sexually Abused:  Not on file     Physical Exam   Vitals:   12/17/19 1745 12/17/19 1800  BP:  96/71  Pulse: 88 92  Resp:    Temp:    SpO2: 93% 93%    CONSTITUTIONAL: Chronically ill-appearing, obese, NAD NEURO:  Alert and oriented x 3, no focal deficits EYES:  eyes equal and reactive ENT/NECK:  no LAD, no JVD CARDIO: Regular rate, well-perfused, normal S1 and S2 PULM:  CTAB no wheezing or rhonchi GI/GU:  normal bowel sounds, non-distended, non-tender MSK/SPINE:  No gross deformities, no edema SKIN:  no rash, atraumatic PSYCH:  Appropriate speech and behavior  *Additional and/or pertinent findings included in MDM below  Diagnostic and Interventional Summary    EKG Interpretation  Date/Time:  12-17-2019 at 18: 20: 18 Ventricular Rate:  90 PR Interval:  269 QRS Duration: 88 QT Interval:  47 QTC Calculation: 425 R Axis:     Text  Interpretation: First-degree AV block, sinus rhythm Confirm by Dr. Gerlene Fee at 7:14 PM      Labs Reviewed  LACTIC ACID, PLASMA - Abnormal; Notable for the following components:      Result Value   Lactic Acid, Venous 2.0 (*)    All other components within normal limits  CBC WITH DIFFERENTIAL/PLATELET - Abnormal; Notable for the following components:   Platelets 117 (*)    Lymphs Abs 0.6 (*)    All other components within normal limits  COMPREHENSIVE METABOLIC PANEL - Abnormal; Notable for the following components:   Sodium 132 (*)    Chloride 96 (*)    Glucose, Bld 265 (*)    BUN 34 (*)    Creatinine, Ser 1.67 (*)    Calcium 8.3 (*)    Albumin 3.0 (*)    AST 50 (*)    Total Bilirubin 1.3 (*)    GFR calc non Af Amer 41 (*)    GFR calc Af Amer 47 (*)    All other components within normal limits  D-DIMER, QUANTITATIVE (NOT AT [facility]) - Abnormal; Notable for the following components:   D-Dimer, Quant 0.69 (*)    All other components within normal limits  LACTATE DEHYDROGENASE - Abnormal; Notable for the following components:   LDH 361 (*)    All other components within normal limits  FIBRINOGEN - Abnormal; Notable for the following components:   Fibrinogen 196 (*)    All other components within normal limits  CULTURE, BLOOD (ROUTINE X 2)  CULTURE, BLOOD (ROUTINE X 2)  PROCALCITONIN  LACTIC ACID, PLASMA  FERRITIN  TRIGLYCERIDES  C-REACTIVE PROTEIN    DG Chest Port 1 View  Final Result      Medications  dexamethasone (DECADRON) injection 10 mg (10 mg Intravenous Given 12/17/19 1836)     Procedures  /  Critical Care .Critical Care Performed by: Maudie Flakes, MD Authorized by: Maudie Flakes, MD   Critical care provider statement:    Critical care time (minutes):  45   Critical care was necessary to treat or prevent imminent or life-threatening deterioration of the following conditions: COVID-19 with hypoxic respiratory failure.   Critical care was time spent  personally by me on the following activities:  Discussions with consultants, evaluation of patient's response to treatment, examination of patient, ordering and performing treatments and interventions, ordering and review of laboratory studies, ordering and review of radiographic studies, pulse oximetry, re-evaluation of patient's condition, obtaining history from patient or surrogate and review of old charts  ED Course and Medical Decision Making  I have reviewed the triage vital signs, the nursing notes, and pertinent available records from the EMR.  Listed above are laboratory and imaging tests that I personally ordered, reviewed, and interpreted and then considered in my medical decision making (see below for details).  COVID-19 with hypoxic respiratory failure, down to 84% on room air, requiring 4 L nasal cannula to maintain saturations between 91 to 93%.  This is only day 3 of illness, raising concern for potential for severe illness and/or ARDS.  Will need admission.     Barth Kirks. Sedonia Small, [place] [email or web address]  Final Clinical Impressions(s) / ED Diagnoses     ICD-10-CM   1. COVID-19  U07.1   2. Acute respiratory failure with hypoxia ([place])  J96.01     ED Discharge Orders    None       Discharge Instructions Discussed with and Provided to Patient:   Discharge Instructions   None       Maudie Flakes, MD 12/17/19 Gayla Doss, MD 12/17/19 [phone]

## 2019-12-17 NOTE — H&P (Addendum)
History and Physical    Demitris Pokorny OVF:[id number] DOB: 09-Dec-1950 DOA: 12/17/2019  PCP: Sharilyn Sites, MD   Patient coming from: Home  Chief Complaint: Weakness/hypoxemia  HPI: [name] is a 69 y.o. male with medical history significant for type 2 diabetes, prior tobacco abuse, obesity, dyslipidemia, and hypertension who presented to the ED with complaints of some generalized weakness and hypoxemia.  He was diagnosed with Covid 3 days ago and has otherwise had mild symptoms.  He states that he has been fully vaccinated with the Moderna vaccine. He has very minimal shortness of breath, but denies any chest pain, abdominal pain, vomiting, or diarrhea.  He denies any fevers or chills.  He was noted to have a pulse ox reading in the 80th percentile at home which is what got his family concerned.  They then urged him to come to the ED for further evaluation.  His wife is also sick at home with Covid symptoms.   ED Course: He is noted to have stable vital signs, but with softer blood pressure readings.  He is currently requiring 4-5 L nasal cannula oxygen for O2 saturation in the 88th percentile.  He is noted to have multifocal pneumonia on chest x-ray and procalcitonin is elevated 0.72.  D-dimer is 0.69.  His creatinine is elevated at 1.67.  Lactic acid is 2.  He has been given 1 dose of Decadron in the ED.  Review of Systems: All others reviewed and otherwise negative except as noted above.  Past Medical History:  Diagnosis Date  . Diabetes mellitus   . Hypercholesteremia   . Peripheral neuropathy   . Sleep apnea     Past Surgical History:  Procedure Laterality Date  . COLONOSCOPY N/A 02/26/2016   Procedure: COLONOSCOPY;  Surgeon: Daneil Dolin, MD;  Location: [facility];  Service: Endoscopy;  Laterality: N/A;  8:30 Am  . HIP SURGERY Right   . LEG SURGERY Left      reports that he has quit smoking. His smoking use included cigarettes. He has never used smokeless tobacco. He  reports that he does not drink alcohol and does not use drugs.  Allergies  Allergen Reactions  . Fish Allergy Nausea And Vomiting  . Motrin [Ibuprofen] Other (See Comments)    Possible reaction resulting in "feeling like bugs crawling all over me"  . Sulfamethoxazole-Trimethoprim Nausea And Vomiting    Family History  Problem Relation Age of Onset  . Lung cancer Mother   . Hiatal hernia Mother   . Heart Problems Father        Pacemaker  . CAD Father   . Benign prostatic hyperplasia Father     Prior to Admission medications   Medication Sig Start Date End Date Taking? Authorizing Provider  aspirin EC 81 MG tablet Take 81 mg by mouth daily.    [provider]  cephALEXin (KEFLEX) 500 MG capsule Take 1 capsule (500 mg total) by mouth 4 (four) times daily. 03/15/19   Kendrick, Caitlyn S, PA-C  glipiZIDE (GLUCOTROL XL) 10 MG 24 hr tablet Take 10 mg by mouth 2 (two) times daily. 07/20/14   [provider]  lisinopril (PRINIVIL,ZESTRIL) 5 MG tablet Take 5 mg by mouth daily. 06/20/14   [provider]  metFORMIN (GLUCOPHAGE) 1000 MG tablet Take 1,000 mg by mouth 2 (two) times daily with a meal.    [provider]  Multiple Vitamins-Minerals (MULTIVITAMIN ADULT PO) Take 1 tablet by mouth daily.    [provider]  pioglitazone (ACTOS) 30 MG tablet Take 30 mg by mouth daily.    [provider]  Probiotic Product (PROBIOTIC-10 PO) Take 1 packet by mouth daily.    [provider]  ranitidine (ZANTAC) 150 MG tablet Take 1 tablet by mouth daily. 07/26/17   [provider]  simvastatin (ZOCOR) 10 MG tablet Take 5 mg by mouth every evening.  06/20/14   [provider]  vitamin B-12 (CYANOCOBALAMIN) 500 MCG tablet Take 500 mcg by mouth daily.    [provider]    Physical Exam: Vitals:   12/17/19 1745 12/17/19 1800 12/17/19 1830 12/17/19 1900  BP:  96/71 (!) 113/55 (!) 115/59  Pulse: 88 92 86 88  Resp:   20 (!)  26  Temp:      TempSrc:      SpO2: 93% 93% 96% 93%  Weight:      Height:        Constitutional: NAD, calm, comfortable, morbidly obese Vitals:   12/17/19 1745 12/17/19 1800 12/17/19 1830 12/17/19 1900  BP:  96/71 (!) 113/55 (!) 115/59  Pulse: 88 92 86 88  Resp:   20 (!) 26  Temp:      TempSrc:      SpO2: 93% 93% 96% 93%  Weight:      Height:       Eyes: lids and conjunctivae normal ENMT: Mucous membranes are moist.  Neck: normal, supple Respiratory: clear to auscultation bilaterally. Normal respiratory effort. No accessory muscle use.  Cardiovascular: Regular rate and rhythm, no murmurs. No extremity edema. Abdomen: no tenderness, no distention. Bowel sounds positive.  Musculoskeletal:  No joint deformity upper and lower extremities.   Skin: no rashes, lesions, ulcers.  Psychiatric: Normal judgment and insight. Alert and oriented x 3. Normal mood.   Labs on Admission: I have personally reviewed following labs and imaging studies  CBC: Recent Labs  Lab 12/14/19 0451 12/17/19 1742  WBC 8.6 7.3  NEUTROABS 7.2 6.2  HGB 15.9 15.2  HCT 48.7 46.6  MCV 91.2 89.4  PLT 110* 510*   Basic Metabolic Panel: Recent Labs  Lab 12/14/19 0451 12/17/19 1742  NA 135 132*  K 4.4 4.2  CL 98 96*  CO2 26 23  GLUCOSE 195* 265*  BUN 22 34*  CREATININE 1.45* 1.67*  CALCIUM 9.0 8.3*   GFR: Estimated Creatinine Clearance: 59.6 mL/min (A) (by C-G formula based on SCr of 1.67 mg/dL (H)). Liver Function Tests: Recent Labs  Lab 12/14/19 0451 12/17/19 1742  AST 56* 50*  ALT 57* 42  ALKPHOS 74 62  BILITOT 1.0 1.3*  PROT 7.5 7.0  ALBUMIN 3.6 3.0*   No results for input(s): LIPASE, AMYLASE in the last 168 hours. No results for input(s): AMMONIA in the last 168 hours. Coagulation Profile: No results for input(s): INR, PROTIME in the last 168 hours. Cardiac Enzymes: No results for input(s): CKTOTAL, CKMB, CKMBINDEX, TROPONINI in the last 168 hours. BNP (last 3 results) No  results for input(s): PROBNP in the last 8760 hours. HbA1C: No results for input(s): HGBA1C in the last 72 hours. CBG: No results for input(s): GLUCAP in the last 168 hours. Lipid Profile: Recent Labs    12/17/19 1742  TRIG 58   Thyroid Function Tests: No results for input(s): TSH, T4TOTAL, FREET4, T3FREE, THYROIDAB in the last 72 hours. Anemia Panel: No results for input(s): VITAMINB12, FOLATE, FERRITIN, TIBC, IRON, RETICCTPCT in the last 72 hours. Urine analysis:    Component Value Date/Time  COLORURINE YELLOW 12/14/2019 0451   APPEARANCEUR HAZY (A) 12/14/2019 0451   LABSPEC 1.023 12/14/2019 0451   PHURINE 5.0 12/14/2019 0451   GLUCOSEU 50 (A) 12/14/2019 0451   HGBUR NEGATIVE 12/14/2019 0451   BILIRUBINUR NEGATIVE 12/14/2019 0451   KETONESUR 20 (A) 12/14/2019 0451   PROTEINUR 30 (A) 12/14/2019 0451   NITRITE NEGATIVE 12/14/2019 0451   LEUKOCYTESUR NEGATIVE 12/14/2019 0451    Radiological Exams on Admission: DG Chest Port 1 View  Result Date: 12/17/2019 CLINICAL DATA:  COVID pneumonia, dyspnea EXAM: PORTABLE CHEST 1 VIEW COMPARISON:  12/14/2019 FINDINGS: In lung volumes are small, but are symmetric and are stable since prior examination. There has, however, developed progressive pulmonary infiltrates within the mid and lower lung zones bilaterally, particularly within the right mid lung zone, in keeping with changes of progressive bronchopneumonia. No pneumothorax or pleural effusion. Cardiac size within normal limits. No acute bone abnormality. IMPRESSION: Progressive pulmonary infiltrates in keeping with multifocal bronchopneumonia. Electronically Signed   By: Fidela Salisbury MD   On: 12/17/2019 17:38    EKG: Independently reviewed. SR 90bpm; low voltage.  Assessment/Plan Active Problems:   Acute hypoxemic respiratory failure due to COVID-19 [facility])    Acute hypoxemic respiratory failure secondary COVID-19 pneumonia -In the setting of prior Moderna vaccination -Start  treatment with remdesivir -Continue steroids with Solu-Medrol dosing 0.5 mg/kg twice daily -D-dimer elevated with obesity-Lovenox 0.5 mg/kg daily; monitor carefully with thrombocytopenia -Start empiric antibiotics with Rocephin and azithromycin given procalcitonin elevation -Monitor inflammatory marker trend -Breathing treatments and antitussives as needed -Check strep pneumonia antigen -Vitamin C and zinc -Incentive spirometry and encourage ambulation  Mild AKI -Baseline creatinine appears to be 1.2-1.4, currently 1.67 -Hydrate and monitor -Avoid nephrotoxic agents  Type 2 diabetes with hyperglycemia -SSI coverage -Linagliptin -Continue close monitoring with steroid use -Hemoglobin A1c  Dyslipidemia -Hold statin for now and trend LFTs, resume if stable  History of hypertension -Hold BP medications with current soft blood pressures  Morbid obesity -Lifestyle changes  DVT prophylaxis: Lovenox Code Status: Full Family Communication: Patient states that he will call his wife Disposition Plan:Admit for COVID PNA treatment Consults called:None Admission status: Inpatient, Tele   Jonus Coble D Katey Barrie DO [facility]  If 7PM-7AM, please contact night-coverage [email or web address]  12/17/2019, 7:12 PM

## 2019-12-17 NOTE — ED Triage Notes (Signed)
Pt to er, pt states that he was here a couple of days ago, states that he was dx with covid.  States that he is here for a decreased O2 sat, pt is currently 93% on 4 L via [place].  Pt states that he also has increased fatigue.

## 2019-12-18 ENCOUNTER — Encounter (HOSPITAL_COMMUNITY): Payer: Self-pay | Admitting: Internal Medicine

## 2019-12-18 ENCOUNTER — Other Ambulatory Visit: Payer: Self-pay

## 2019-12-18 DIAGNOSIS — IMO0002 Reserved for concepts with insufficient information to code with codable children: Secondary | ICD-10-CM | POA: Diagnosis present

## 2019-12-18 DIAGNOSIS — U071 COVID-19: Secondary | ICD-10-CM | POA: Diagnosis present

## 2019-12-18 DIAGNOSIS — J1282 Pneumonia due to coronavirus disease 2019: Secondary | ICD-10-CM | POA: Diagnosis present

## 2019-12-18 LAB — COMPREHENSIVE METABOLIC PANEL
ALT: 38 U/L (ref 0–44)
AST: 48 U/L — ABNORMAL HIGH (ref 15–41)
Albumin: 2.8 g/dL — ABNORMAL LOW (ref 3.5–5.0)
Alkaline Phosphatase: 59 U/L (ref 38–126)
Anion gap: 11 (ref 5–15)
BUN: 37 mg/dL — ABNORMAL HIGH (ref 8–23)
CO2: 19 mmol/L — ABNORMAL LOW (ref 22–32)
Calcium: 7.6 mg/dL — ABNORMAL LOW (ref 8.9–10.3)
Chloride: 101 mmol/L (ref 98–111)
Creatinine, Ser: 1.48 mg/dL — ABNORMAL HIGH (ref 0.61–1.24)
GFR calc Af Amer: 55 mL/min — ABNORMAL LOW (ref >60)
GFR calc non Af Amer: 47 mL/min — ABNORMAL LOW (ref >60)
Glucose, Bld: 353 mg/dL — ABNORMAL HIGH (ref 70–99)
Potassium: 6 mmol/L — ABNORMAL HIGH (ref 3.5–5.1)
Sodium: 131 mmol/L — ABNORMAL LOW (ref 135–145)
Total Bilirubin: 0.9 mg/dL (ref 0.3–1.2)
Total Protein: 6.8 g/dL (ref 6.5–8.1)

## 2019-12-18 LAB — CBC WITH DIFFERENTIAL/PLATELET
Abs Immature Granulocytes: 0.03 10*3/uL (ref 0.00–0.07)
Basophils Absolute: 0 10*3/uL (ref 0.0–0.1)
Basophils Relative: 0 %
Eosinophils Absolute: 0 10*3/uL (ref 0.0–0.5)
Eosinophils Relative: 0 %
HCT: 46.8 % (ref 39.0–52.0)
Hemoglobin: 15.1 g/dL (ref 13.0–17.0)
Immature Granulocytes: 1 %
Lymphocytes Relative: 14 %
Lymphs Abs: 0.5 10*3/uL — ABNORMAL LOW (ref 0.7–4.0)
MCH: 29.2 pg (ref 26.0–34.0)
MCHC: 32.3 g/dL (ref 30.0–36.0)
MCV: 90.5 fL (ref 80.0–100.0)
Monocytes Absolute: 0.1 10*3/uL (ref 0.1–1.0)
Monocytes Relative: 3 %
Neutro Abs: 3.3 10*3/uL (ref 1.7–7.7)
Neutrophils Relative %: 82 %
Platelets: 116 10*3/uL — ABNORMAL LOW (ref 150–400)
RBC: 5.17 MIL/uL (ref 4.22–5.81)
RDW: 13.4 % (ref 11.5–15.5)
WBC: 4 10*3/uL (ref 4.0–10.5)
nRBC: 0 % (ref 0.0–0.2)

## 2019-12-18 LAB — CBG MONITORING, ED
Glucose-Capillary: 312 mg/dL — ABNORMAL HIGH (ref 70–99)
Glucose-Capillary: 405 mg/dL — ABNORMAL HIGH (ref 70–99)

## 2019-12-18 LAB — D-DIMER, QUANTITATIVE: D-Dimer, Quant: 0.72 ug/mL-FEU — ABNORMAL HIGH (ref 0.00–0.50)

## 2019-12-18 LAB — HEMOGLOBIN A1C
Hgb A1c MFr Bld: 8.9 % — ABNORMAL HIGH (ref 4.8–5.6)
Mean Plasma Glucose: 208.73 mg/dL

## 2019-12-18 LAB — GLUCOSE, CAPILLARY
Glucose-Capillary: 369 mg/dL — ABNORMAL HIGH (ref 70–99)
Glucose-Capillary: 301 mg/dL — ABNORMAL HIGH (ref 70–99)

## 2019-12-18 LAB — HIV ANTIBODY (ROUTINE TESTING W REFLEX): HIV Screen 4th Generation wRfx: NONREACTIVE

## 2019-12-18 LAB — C-REACTIVE PROTEIN: CRP: 14.6 mg/dL — ABNORMAL HIGH (ref <1.0)

## 2019-12-18 LAB — MAGNESIUM: Magnesium: 2.5 mg/dL — ABNORMAL HIGH (ref 1.7–2.4)

## 2019-12-18 LAB — FERRITIN: Ferritin: 668 ng/mL — ABNORMAL HIGH (ref 24–336)

## 2019-12-18 MED ORDER — SODIUM ZIRCONIUM CYCLOSILICATE 10 G PO PACK
10.0000 g | PACK | Freq: Every day | ORAL | Status: AC
  Administered 2019-12-18 – 2019-12-19 (×2): 10 g via ORAL
  Filled 2019-12-18 (×2): qty 1

## 2019-12-18 MED ORDER — INSULIN ASPART 100 UNIT/ML ~~LOC~~ SOLN
24.0000 [IU] | Freq: Once | SUBCUTANEOUS | Status: AC
  Administered 2019-12-18: 24 [IU] via SUBCUTANEOUS
  Filled 2019-12-18: qty 1

## 2019-12-18 MED ORDER — ALBUTEROL SULFATE HFA 108 (90 BASE) MCG/ACT IN AERS
2.0000 | INHALATION_SPRAY | Freq: Four times a day (QID) | RESPIRATORY_TRACT | Status: DC
  Administered 2019-12-18 – 2019-12-20 (×7): 2 via RESPIRATORY_TRACT

## 2019-12-18 MED ORDER — GUAIFENESIN ER 600 MG PO TB12
600.0000 mg | ORAL_TABLET | Freq: Two times a day (BID) | ORAL | Status: DC
  Administered 2019-12-18 – 2019-12-25 (×14): 600 mg via ORAL
  Filled 2019-12-18 (×14): qty 1

## 2019-12-18 MED ORDER — INSULIN GLARGINE 100 UNIT/ML ~~LOC~~ SOLN
12.0000 [IU] | Freq: Every day | SUBCUTANEOUS | Status: DC
  Administered 2019-12-18 – 2019-12-19 (×2): 12 [IU] via SUBCUTANEOUS
  Filled 2019-12-18 (×3): qty 0.12

## 2019-12-18 NOTE — Progress Notes (Signed)
Patient Demographics:    [name], is a 69 y.o. male, DOB - 27-May-1950, HDQ:[id number]  Admit date - 12/17/2019   Admitting Physician Bandera, DO  Outpatient Primary MD for the patient is Sharilyn Sites, MD  LOS - 1   Chief Complaint  Patient presents with  . Shortness of Breath  . Covid Positive        Subjective:    [name] today has no fevers, no emesis,  No chest pain,   --Attempted unsuccessfully to wean off O2 -Hypoxia persist, oxygen requirement improved down to 4 L/min  -Had Loose stool    Assessment  & Plan :    Principal Problem:   Acute hypoxemic respiratory failure due to COVID-19 [facility]) Active Problems:   Pneumonia due to COVID-19 virus   Morbid obesity with BMI of 45.0-49.9, adult ([place])   Diabetes mellitus type 2, uncontrolled ([place])    Brief Summary:- 69 y.o. male with medical history significant for type 2 diabetes, prior tobacco abuse, Morbid obesity, HTN, HLD admitted on 12/17/19 with Covid PNA with Hypoxia--- Pt was previously Vaccinated against Covid 69 (Moderna)  A/p 1)Acute hypoxic respiratory failure secondary to COVID-19 infection/Pneumonia--- The treatment plan and use of medications  for treatment of COVID-19 infection and possible side effects were discussed with patient/family --Attempted unsuccessfully to wean off O2 -Hypoxia persist, oxygen requirement improved down to 4 L/min -----Patient/Family verbalizes understanding and agrees to treatment protocols  --Patient is positive for COVID-19 infection, chest x-ray with findings of infiltrates/opacities,  patient is tachypneic/hypoxic and requiring continuous supplemental oxygen---patient meets criteria for initiation of Remdesivir AND Steroid therapy per protocol  Ferritin 530>> 668 CRP 15.0 >> 14.6 D-Dimer 0.69 >> 0.72 WBC --4.0 Fibrinogen 196 PCT 0.72---treated with azithromycin and  Rocephin --Check and trend inflammatory markers including D-dimer, ferritin and  CRP---also follow CBC and CMP --Supplemental oxygen to keep O2 sats above 93% -Follow serial chest x-rays and ABGs as indicated --- Encourage prone positioning for More than 16 hours/day in increments of 2 to 3 hours at a time if able to tolerate --Attempt to maintain euvolemic state --Zinc and vitamin C as ordered -Albuterol inhaler as needed -Accu-Cheks/fingersticks while on high-dose steroids -PPI while on high-dose steroids -Enhanced dosage of anticoagulant for DVT prophylaxis given hypercoagulable state with COVID-19 infections -Continue Remdesivir and Steroids started on 12/17/2019  2)HyperKalemia--- potassium is 6.0, give Lokelma   3)AKI on CKD-- IIIa---- with HyperKalemia--- ---  renally adjust medications, avoid nephrotoxic agents / dehydration  / hypotension  4)DM2-- A1c 8.9, reflecting uncontrolled diabetes with hyperglycemia  --hold Actos, Metformin , glipizide -Start Lantus and Use Novolog/Humalog Sliding scale insulin with Accu-Cheks/Fingersticks as ordered  5)Morbid Obesity-this complicates overall care and increases morbidity and mortality -Low calorie diet, portion control and increase physical activity discussed with patient--after resolution of acute symptoms -Body mass index is 48.73 kg/m.  Disposition/Need for in-Hospital Stay- patient unable to be discharged at this time due to ----acute hypoxic respiratory failure secondary to Covid pneumonia requiring IV remdesivir and steroids and close monitoring of labs data and vital signs  Status is: Inpatient  Remains inpatient appropriate because:acute hypoxic respiratory failure secondary to Covid pneumonia requiring IV remdesivir and steroids and close monitoring of labs data and vital  signs   Disposition: The patient is from: Home              Anticipated d/c is to: Home              Anticipated d/c date is: 3 days              Patient  currently is not medically stable to d/c. Barriers: Not Clinically Stable- acute hypoxic respiratory failure secondary to Covid pneumonia requiring IV remdesivir and steroids and close monitoring of labs data and vital signs  Code Status : Full code  Family Communication:     (patient is alert, awake and coherent)   Consults  :  na  DVT Prophylaxis  :  Lovenox -   - SCDs   Lab Results  Component Value Date   PLT 116 (L) 12/18/2019    Inpatient Medications  Scheduled Meds: . albuterol  2 puff Inhalation Q6H WA  . vitamin C  500 mg Oral Daily  . aspirin EC  81 mg Oral Daily  . enoxaparin (LOVENOX) injection  0.5 mg/kg Subcutaneous Q24H  . insulin aspart  0-20 Units Subcutaneous TID WC  . insulin aspart  0-5 Units Subcutaneous QHS  . insulin glargine  12 Units Subcutaneous Daily  . linagliptin  5 mg Oral Daily  . methylPREDNISolone (SOLU-MEDROL) injection  0.5 mg/kg Intravenous Q12H   Followed by  . [START ON 12/27/2019] predniSONE  50 mg Oral Daily  . pantoprazole  40 mg Oral Daily  . vitamin B-12  500 mcg Oral Daily  . zinc sulfate  220 mg Oral Daily   Continuous Infusions: . azithromycin Stopped (12/17/19 2252)  . cefTRIAXone (ROCEPHIN)  IV Stopped (12/17/19 2212)  . remdesivir 100 mg in NS 100 mL 100 mg (12/18/19 0937)   PRN Meds:.acetaminophen, chlorpheniramine-HYDROcodone, guaiFENesin-dextromethorphan, ondansetron **OR** ondansetron (ZOFRAN) IV    Anti-infectives (From admission, onward)   Start     Dose/Rate Route Frequency Ordered Stop   12/18/19 1000  remdesivir 100 mg in sodium chloride 0.9 % 100 mL IVPB  Status:  Discontinued       "Followed by" Linked Group Details   100 mg 200 mL/hr over 30 Minutes Intravenous Daily 12/17/19 2028 12/17/19 2050   12/18/19 1000  remdesivir 100 mg in sodium chloride 0.9 % 100 mL IVPB        100 mg 200 mL/hr over 30 Minutes Intravenous Daily 12/17/19 2053 12/22/19 0959   12/17/19 2100  remdesivir 100 mg in sodium chloride 0.9  % 100 mL IVPB       "And" Linked Group Details   100 mg 200 mL/hr over 30 Minutes Intravenous  Once 12/17/19 2053 12/17/19 2253   12/17/19 2100  remdesivir 100 mg in sodium chloride 0.9 % 100 mL IVPB       "And" Linked Group Details   100 mg 200 mL/hr over 30 Minutes Intravenous  Once 12/17/19 2053 12/17/19 2253   12/17/19 2057  cefTRIAXone (ROCEPHIN) 2 g in sodium chloride 0.9 % 100 mL IVPB        2 g 200 mL/hr over 30 Minutes Intravenous Every 24 hours 12/17/19 2057     12/17/19 2030  remdesivir 200 mg in sodium chloride 0.9% 250 mL IVPB  Status:  Discontinued       "Followed by" Linked Group Details   200 mg 580 mL/hr over 30 Minutes Intravenous Once 12/17/19 2028 12/17/19 2050   12/17/19 2030  cefTRIAXone (ROCEPHIN) 1 g in sodium  chloride 0.9 % 100 mL IVPB  Status:  Discontinued        1 g 200 mL/hr over 30 Minutes Intravenous Every 24 hours 12/17/19 2029 12/17/19 2057   12/17/19 2030  azithromycin (ZITHROMAX) 500 mg in sodium chloride 0.9 % 250 mL IVPB        500 mg 250 mL/hr over 60 Minutes Intravenous Every 24 hours 12/17/19 2029          Objective:   Vitals:   12/18/19 1300 12/18/19 1400 12/18/19 1405 12/18/19 1535  BP: 123/65 (!) 116/91 (!) 150/63 135/62  Pulse: 79  84 87  Resp: 18 (!) 27 (!) 22 20  Temp:  97.8 F (36.6 C)  97.6 F (36.4 C)  TempSrc:  Oral  Oral  SpO2: 91%  91% 90%  Weight:      Height:        Wt Readings from Last 3 Encounters:  12/17/19 (!) 149.7 kg  03/15/19 (!) 149.7 kg  11/21/17 (!) 149 kg     Intake/Output Summary (Last 24 hours) at 12/18/2019 1625 Last data filed at 12/18/2019 1600 Gross per 24 hour  Intake 2894.69 ml  Output 750 ml  Net 2144.69 ml     Physical Exam  Gen:- Awake Alert, morbidly obese HEENT:- [place].AT, No sclera icterus Neck-Supple Neck,No JVD,.  Nose- 4L/min Lungs-diminished in bases with scattered rhonchi and wheezes CV- S1, S2 normal, regular  Abd-  +ve B.Sounds, Abd Soft, No tenderness, increased truncal  adiposity    Extremity/Skin:- No  edema, pedal pulses present  Psych-affect is appropriate, oriented x3 Neuro-no new focal deficits, no tremors   Data Review:   Micro Results Recent Results (from the past 240 hour(s))  SARS Coronavirus 2 by RT PCR (hospital order, performed in [facility] hospital lab) Nasopharyngeal Nasopharyngeal Swab     Status: Abnormal   Collection Time: 12/14/19  4:39 AM   Specimen: Nasopharyngeal Swab  Result Value Ref Range Status   SARS Coronavirus 2 POSITIVE (A) NEGATIVE Final    Comment: RESULT CALLED TO, READ BACK BY AND VERIFIED WITH: GIBSON,T AT 0530 ON 12/14/19 BY HUFFINES,S  (NOTE) SARS-CoV-2 target nucleic acids are DETECTED  SARS-CoV-2 RNA is generally detectable in upper respiratory specimens  during the acute phase of infection.  Positive results are indicative  of the presence of the identified virus, but do not rule out bacterial infection or co-infection with other pathogens not detected by the test.  Clinical correlation with patient history and  other diagnostic information is necessary to determine patient infection status.  The expected result is negative.  Fact Sheet for Patients:   StrictlyIdeas.no   Fact Sheet for Healthcare Providers:   BankingDealers.co.za    This test is not yet approved or cleared by the [place] FDA and  has been authorized for detection and/or diagnosis of SARS-CoV-2 by FDA under an Emergency Use Authorization (EUA).  This EUA will remain in effect (meanin g this test can be used) for the duration of  the COVID-19 declaration under Section 564(b)(1) of the Act, 21 U.S.C. section 360-bbb-3(b)(1), unless the authorization is terminated or revoked sooner.  Performed at [facility], [address], [place], [place] [address]   Blood Culture (routine x 2)     Status: None (Preliminary result)   Collection Time: 12/17/19  5:50 PM   Specimen: Left Antecubital;  Blood  Result Value Ref Range Status   Specimen Description LEFT ANTECUBITAL  Final   Special Requests  Final    BOTTLES DRAWN AEROBIC AND ANAEROBIC Blood Culture adequate volume   Culture   Final    NO GROWTH < 24 HOURS Performed at [facility], [address], [place], [place] [address]    Report Status PENDING  Incomplete  Blood Culture (routine x 2)     Status: None (Preliminary result)   Collection Time: 12/17/19  5:58 PM   Specimen: BLOOD LEFT ARM  Result Value Ref Range Status   Specimen Description BLOOD LEFT ARM  Final   Special Requests   Final    BOTTLES DRAWN AEROBIC AND ANAEROBIC Blood Culture adequate volume   Culture   Final    NO GROWTH < 24 HOURS Performed at [facility], [address] [place], [place] [address]    Report Status PENDING  Incomplete    Radiology Reports DG Chest Port 1 View  Result Date: 12/17/2019 CLINICAL DATA:  COVID pneumonia, dyspnea EXAM: PORTABLE CHEST 1 VIEW COMPARISON:  12/14/2019 FINDINGS: In lung volumes are small, but are symmetric and are stable since prior examination. There has, however, developed progressive pulmonary infiltrates within the mid and lower lung zones bilaterally, particularly within the right mid lung zone, in keeping with changes of progressive bronchopneumonia. No pneumothorax or pleural effusion. Cardiac size within normal limits. No acute bone abnormality. IMPRESSION: Progressive pulmonary infiltrates in keeping with multifocal bronchopneumonia. Electronically Signed   By: Fidela Salisbury MD   On: 12/17/2019 17:38   DG Chest Portable 1 View  Result Date: 12/14/2019 CLINICAL DATA:  Patient collapsed.  Wife is COVID-19 positive. EXAM: PORTABLE CHEST 1 VIEW COMPARISON:  08/24/2017. FINDINGS: Mediastinum and hilar structures normal. Heart size normal. Low lung volumes. Mild bibasilar and or scarring again noted. Mild left base infiltrate cannot be excluded. Small left pleural effusion cannot be excluded. No pneumothorax.  Degenerative change thoracic spine. IMPRESSION: Mild bibasilar atelectasis and or scarring again noted. Mild left base infiltrate cannot be excluded. Small left pleural effusion cannot be excluded. Electronically Signed   By: Marcello Moores  Register   On: 12/14/2019 05:26     CBC Recent Labs  Lab 12/14/19 0451 12/17/19 1742 12/18/19 0645  WBC 8.6 7.3 4.0  HGB 15.9 15.2 15.1  HCT 48.7 46.6 46.8  PLT 110* 117* 116*  MCV 91.2 89.4 90.5  MCH 29.8 29.2 29.2  MCHC 32.6 32.6 32.3  RDW 13.5 13.5 13.4  LYMPHSABS 0.7 0.6* 0.5*  MONOABS 0.7 0.4 0.1  EOSABS 0.0 0.0 0.0  BASOSABS 0.0 0.0 0.0    Chemistries  Recent Labs  Lab 12/14/19 0451 12/17/19 1742 12/18/19 0645  NA 135 132* 131*  K 4.4 4.2 6.0*  CL 98 96* 101  CO2 26 23 19*  GLUCOSE 195* 265* 353*  BUN 22 34* 37*  CREATININE 1.45* 1.67* 1.48*  CALCIUM 9.0 8.3* 7.6*  MG  --   --  2.5*  AST 56* 50* 48*  ALT 57* 42 38  ALKPHOS 74 62 59  BILITOT 1.0 1.3* 0.9   ------------------------------------------------------------------------------------------------------------------ Recent Labs    12/17/19 1742  TRIG 58    Lab Results  Component Value Date   HGBA1C 8.9 (H) 12/18/2019   ------------------------------------------------------------------------------------------------------------------ No results for input(s): TSH, T4TOTAL, T3FREE, THYROIDAB in the last 72 hours.  Invalid input(s): FREET3 ------------------------------------------------------------------------------------------------------------------ Recent Labs    12/17/19 1742 12/18/19 0646  FERRITIN 530* 668*    Coagulation profile No results for input(s): INR, PROTIME in the last 168 hours.  Recent Labs    12/17/19 1742  12/18/19 0645  DDIMER 0.69* 0.72*    Cardiac Enzymes No results for input(s): CKMB, TROPONINI, MYOGLOBIN in the last 168 hours.  Invalid input(s):  CK ------------------------------------------------------------------------------------------------------------------    Component Value Date/Time   BNP 50.0 08/24/2017 0750     Roxan Hockey M.D on 12/18/2019 at 4:25 PM  Go to [email or web address] - for contact info  [facility] - Office  [phone]

## 2019-12-18 NOTE — ED Notes (Signed)
Pt requested to be shaved. Pt shaved by NT.

## 2019-12-19 LAB — CBC WITH DIFFERENTIAL/PLATELET
Abs Immature Granulocytes: 0.07 10*3/uL (ref 0.00–0.07)
Basophils Absolute: 0 10*3/uL (ref 0.0–0.1)
Basophils Relative: 0 %
Eosinophils Absolute: 0 10*3/uL (ref 0.0–0.5)
Eosinophils Relative: 0 %
HCT: 43.8 % (ref 39.0–52.0)
Hemoglobin: 14.3 g/dL (ref 13.0–17.0)
Immature Granulocytes: 1 %
Lymphocytes Relative: 6 %
Lymphs Abs: 0.7 10*3/uL (ref 0.7–4.0)
MCH: 29.2 pg (ref 26.0–34.0)
MCHC: 32.6 g/dL (ref 30.0–36.0)
MCV: 89.6 fL (ref 80.0–100.0)
Monocytes Absolute: 0.4 10*3/uL (ref 0.1–1.0)
Monocytes Relative: 4 %
Neutro Abs: 9.4 10*3/uL — ABNORMAL HIGH (ref 1.7–7.7)
Neutrophils Relative %: 89 %
Platelets: 144 10*3/uL — ABNORMAL LOW (ref 150–400)
RBC: 4.89 MIL/uL (ref 4.22–5.81)
RDW: 13.5 % (ref 11.5–15.5)
WBC: 10.6 10*3/uL — ABNORMAL HIGH (ref 4.0–10.5)
nRBC: 0 % (ref 0.0–0.2)

## 2019-12-19 LAB — GLUCOSE, CAPILLARY
Glucose-Capillary: 354 mg/dL — ABNORMAL HIGH (ref 70–99)
Glucose-Capillary: 474 mg/dL — ABNORMAL HIGH (ref 70–99)
Glucose-Capillary: 319 mg/dL — ABNORMAL HIGH (ref 70–99)
Glucose-Capillary: 224 mg/dL — ABNORMAL HIGH (ref 70–99)

## 2019-12-19 LAB — COMPREHENSIVE METABOLIC PANEL
ALT: 36 U/L (ref 0–44)
AST: 38 U/L (ref 15–41)
Albumin: 2.6 g/dL — ABNORMAL LOW (ref 3.5–5.0)
Alkaline Phosphatase: 61 U/L (ref 38–126)
Anion gap: 14 (ref 5–15)
BUN: 34 mg/dL — ABNORMAL HIGH (ref 8–23)
CO2: 24 mmol/L (ref 22–32)
Calcium: 8 mg/dL — ABNORMAL LOW (ref 8.9–10.3)
Chloride: 100 mmol/L (ref 98–111)
Creatinine, Ser: 1.25 mg/dL — ABNORMAL HIGH (ref 0.61–1.24)
GFR calc Af Amer: >60 mL/min (ref >60)
GFR calc non Af Amer: 58 mL/min — ABNORMAL LOW (ref >60)
Glucose, Bld: 334 mg/dL — ABNORMAL HIGH (ref 70–99)
Potassium: 5 mmol/L (ref 3.5–5.1)
Sodium: 138 mmol/L (ref 135–145)
Total Bilirubin: 0.9 mg/dL (ref 0.3–1.2)
Total Protein: 6.2 g/dL — ABNORMAL LOW (ref 6.5–8.1)

## 2019-12-19 LAB — MAGNESIUM: Magnesium: 2.4 mg/dL (ref 1.7–2.4)

## 2019-12-19 LAB — FERRITIN: Ferritin: 48 ng/mL (ref 24–336)

## 2019-12-19 LAB — D-DIMER, QUANTITATIVE: D-Dimer, Quant: 0.7 ug/mL-FEU — ABNORMAL HIGH (ref 0.00–0.50)

## 2019-12-19 LAB — C-REACTIVE PROTEIN: CRP: 7.5 mg/dL — ABNORMAL HIGH (ref <1.0)

## 2019-12-19 MED ORDER — INSULIN GLARGINE 100 UNIT/ML ~~LOC~~ SOLN
20.0000 [IU] | Freq: Every day | SUBCUTANEOUS | Status: DC
  Administered 2019-12-19: 20 [IU] via SUBCUTANEOUS
  Filled 2019-12-19 (×2): qty 0.2

## 2019-12-19 MED ORDER — INSULIN GLARGINE 100 UNIT/ML ~~LOC~~ SOLN
28.0000 [IU] | Freq: Every day | SUBCUTANEOUS | Status: DC
  Filled 2019-12-19: qty 0.28

## 2019-12-19 MED ORDER — INSULIN ASPART 100 UNIT/ML ~~LOC~~ SOLN
28.0000 [IU] | Freq: Once | SUBCUTANEOUS | Status: AC
  Administered 2019-12-19: 28 [IU] via SUBCUTANEOUS

## 2019-12-19 NOTE — Progress Notes (Signed)
Inpatient Diabetes Program Recommendations  AACE/ADA: New Consensus Statement on Inpatient Glycemic Control  Target Ranges:  Prepandial:   less than 140 mg/dL      Peak postprandial:   less than 180 mg/dL (1-2 hours)      Critically ill patients:  140 - 180 mg/dL  Results for [name], [name] (MRN [id number]) as of 12/19/2019 08:01  Ref. Range 12/18/2019 08:11 12/18/2019 12:09 12/18/2019 17:51 12/18/2019 22:20 12/19/2019 07:52  Glucose-Capillary Latest Ref Range: 70 - 99 mg/dL 312 (H) 405 (H) 369 (H) 301 (H) 354 (H)    Review of Glycemic Control  Diabetes history: DM2 Outpatient Diabetes medications: Trulicity 4.5 mg Qweek (Wednesday), Glipizide XL 10 mg BID, Metformin 1000 mg BID, Actos 30 mg daily Current orders for Inpatient glycemic control: Lantus 12 units daily, Novolog 0-20 units TID with meals, Novolog 0-5 units QHS, Tradjenta 5 mg daily; Solumedrol 75 mg Q12H  Inpatient Diabetes Program Recommendations:    Insulin-If steroids are continued as ordered, please consider increasing Lantus to 30 units daily (based on 149.7 kg x 0.2 units) and Novolog 10 units TID with meals for meal coverage if patient eats at least 50% of meals.  Thanks, Lawrence Alderman, RN, MSN, CDE Diabetes Coordinator Inpatient Diabetes Program [phone] (Team Pager from 8am to 5pm)

## 2019-12-19 NOTE — Progress Notes (Signed)
Patient Demographics:    [name], is a 69 y.o. male, DOB - 07-23-1950, LPF:[id number]  Admit date - 12/17/2019   Admitting Physician Pratik Darleen [place], DO  Outpatient Primary MD for the patient is Sharilyn Sites, MD  LOS - 2   Chief Complaint  Patient presents with  . Shortness of Breath  . Covid Positive        Subjective:    [name] today has no fevers, no emesis,  No chest pain,    Shortness of breath and hypoxia persist, continues to require oxygen at  4 L/min----becomes very dyspneic with minimum activity,  very reluctant to get out of bed    Assessment  & Plan :    Principal Problem:   Acute hypoxemic respiratory failure due to COVID-19 [facility]) Active Problems:   Pneumonia due to COVID-19 virus   Morbid obesity with BMI of 45.0-49.9, adult ([place])   Diabetes mellitus type 2, uncontrolled ([place])    Brief Summary:- 69 y.o. male with medical history significant for type 2 diabetes, prior tobacco abuse, Morbid obesity, HTN, HLD admitted on 12/17/19 with Covid PNA with Hypoxia--- Pt was previously Vaccinated against Covid 21 (Moderna)  A/p 1)Acute hypoxic respiratory failure secondary to COVID-19 infection/Pneumonia--- The treatment plan and use of medications  for treatment of COVID-19 infection and possible side effects were discussed with patient/family -Shortness of breath and hypoxia persist, continues to require oxygen at  4 L/min----becomes very dyspneic with minimum activity,  very reluctant to get out of bed -----Patient/Family verbalizes understanding and agrees to treatment protocols  --Patient is positive for COVID-19 infection, chest x-ray with findings of infiltrates/opacities,  patient is tachypneic/hypoxic and requiring continuous supplemental oxygen---patient meets criteria for initiation of Remdesivir AND Steroid therapy per protocol  Ferritin 530>> 668>> 48 CRP 15.0 >>  14.6>>7.5 D-Dimer 0.69 >> 0.72>>0.70 WBC --4.0>> 10.6 (steroids) Fibrinogen 196 PCT 0.72---treated with azithromycin and Rocephin --Check and trend inflammatory markers including D-dimer, ferritin and  CRP---also follow CBC and CMP --Supplemental oxygen to keep O2 sats above 93% -Follow serial chest x-rays and ABGs as indicated --- Encourage prone positioning for More than 16 hours/day in increments of 2 to 3 hours at a time if able to tolerate --Attempt to maintain euvolemic state --Zinc and vitamin C as ordered -Albuterol inhaler as needed -Accu-Cheks/fingersticks while on high-dose steroids -PPI while on high-dose steroids -Enhanced dosage of anticoagulant for DVT prophylaxis given hypercoagulable state with COVID-19 infections -Continue Remdesivir and Steroids started on 12/17/2019  2)HyperKalemia--- potassium is down to 5.0 from  6.0 after Lokelma   3)AKI on CKD-- IIIa---- with HyperKalemia--- ---  renally adjust medications, avoid nephrotoxic agents / dehydration  / hypotension  4)DM2-- A1c 8.9, reflecting uncontrolled diabetes with hyperglycemia  --hold Actos, Metformin , glipizide -Increase Lantus to 20 units  and Use Novolog/Humalog Sliding scale insulin with Accu-Cheks/Fingersticks as ordered  5)Morbid Obesity-this complicates overall care and increases morbidity and mortality -Low calorie diet, portion control and increase physical activity discussed with patient--after resolution of acute symptoms -Body mass index is 48.73 kg/m.  Disposition/Need for in-Hospital Stay- patient unable to be discharged at this time due to ----acute hypoxic respiratory failure secondary to Covid pneumonia requiring IV remdesivir and steroids and close monitoring of labs  data and vital signs  Status is: Inpatient  Remains inpatient appropriate because:acute hypoxic respiratory failure secondary to Covid pneumonia requiring IV remdesivir and steroids and close monitoring of labs data and vital  signs   Disposition: The patient is from: Home              Anticipated d/c is to: Home              Anticipated d/c date is: 3 days              Patient currently is not medically stable to d/c. Barriers: Not Clinically Stable- acute hypoxic respiratory failure secondary to Covid pneumonia requiring IV remdesivir and steroids and close monitoring of labs data and vital signs  Code Status : Full code  Family Communication:     (patient is alert, awake and coherent)   Consults  :  na  DVT Prophylaxis  :  Lovenox -   - SCDs   Lab Results  Component Value Date   PLT 144 (L) 12/19/2019    Inpatient Medications  Scheduled Meds: . albuterol  2 puff Inhalation QID  . vitamin C  500 mg Oral Daily  . aspirin EC  81 mg Oral Daily  . enoxaparin (LOVENOX) injection  0.5 mg/kg Subcutaneous Q24H  . guaiFENesin  600 mg Oral BID  . insulin aspart  0-20 Units Subcutaneous TID WC  . insulin aspart  0-5 Units Subcutaneous QHS  . insulin glargine  20 Units Subcutaneous Daily  . linagliptin  5 mg Oral Daily  . methylPREDNISolone (SOLU-MEDROL) injection  0.5 mg/kg Intravenous Q12H   Followed by  . [START ON 12/27/2019] predniSONE  50 mg Oral Daily  . pantoprazole  40 mg Oral Daily  . vitamin B-12  500 mcg Oral Daily  . zinc sulfate  220 mg Oral Daily   Continuous Infusions: . remdesivir 100 mg in NS 100 mL 100 mg (12/19/19 1046)   PRN Meds:.acetaminophen, chlorpheniramine-HYDROcodone, guaiFENesin-dextromethorphan, ondansetron **OR** ondansetron (ZOFRAN) IV   Anti-infectives (From admission, onward)   Start     Dose/Rate Route Frequency Ordered Stop   12/18/19 1000  remdesivir 100 mg in sodium chloride 0.9 % 100 mL IVPB  Status:  Discontinued       "Followed by" Linked Group Details   100 mg 200 mL/hr over 30 Minutes Intravenous Daily 12/17/19 2028 12/17/19 2050   12/18/19 1000  remdesivir 100 mg in sodium chloride 0.9 % 100 mL IVPB        100 mg 200 mL/hr over 30 Minutes Intravenous  Daily 12/17/19 2053 12/22/19 0959   12/17/19 2100  remdesivir 100 mg in sodium chloride 0.9 % 100 mL IVPB       "And" Linked Group Details   100 mg 200 mL/hr over 30 Minutes Intravenous  Once 12/17/19 2053 12/17/19 2253   12/17/19 2100  remdesivir 100 mg in sodium chloride 0.9 % 100 mL IVPB       "And" Linked Group Details   100 mg 200 mL/hr over 30 Minutes Intravenous  Once 12/17/19 2053 12/17/19 2253   12/17/19 2057  cefTRIAXone (ROCEPHIN) 2 g in sodium chloride 0.9 % 100 mL IVPB        2 g 200 mL/hr over 30 Minutes Intravenous Every 24 hours 12/17/19 2057 12/18/19 2301   12/17/19 2030  remdesivir 200 mg in sodium chloride 0.9% 250 mL IVPB  Status:  Discontinued       "Followed by" Linked Group Details  200 mg 580 mL/hr over 30 Minutes Intravenous Once 12/17/19 2028 12/17/19 2050   12/17/19 2030  cefTRIAXone (ROCEPHIN) 1 g in sodium chloride 0.9 % 100 mL IVPB  Status:  Discontinued        1 g 200 mL/hr over 30 Minutes Intravenous Every 24 hours 12/17/19 2029 12/17/19 2057   12/17/19 2030  azithromycin (ZITHROMAX) 500 mg in sodium chloride 0.9 % 250 mL IVPB        500 mg 250 mL/hr over 60 Minutes Intravenous Every 24 hours 12/17/19 2029 12/18/19 2322        Objective:   Vitals:   12/19/19 0726 12/19/19 1128 12/19/19 1358 12/19/19 1513  BP:   (!) 160/77   Pulse:   84   Resp:   18   Temp:      TempSrc:      SpO2: 92% 93% (!) 89% 90%  Weight:      Height:        Wt Readings from Last 3 Encounters:  12/17/19 (!) 149.7 kg  03/15/19 (!) 149.7 kg  11/21/17 (!) 149 kg     Intake/Output Summary (Last 24 hours) at 12/19/2019 1815 Last data filed at 12/19/2019 1200 Gross per 24 hour  Intake 100.52 ml  Output 1250 ml  Net -1149.48 ml   Physical Exam  Gen:- Awake Alert, morbidly obese HEENT:- [place].AT, No sclera icterus Neck-Supple Neck,No JVD,.  Nose- 4L/min Lungs-diminished in bases with scattered rhonchi and wheezes CV- S1, S2 normal, regular  Abd-  +ve B.Sounds, Abd  Soft, No tenderness, increased truncal adiposity    Extremity/Skin:- No  edema, pedal pulses present  Psych-affect is appropriate, oriented x3 Neuro-Generalized weakness, no new focal deficits, no tremors   Data Review:   Micro Results Recent Results (from the past 240 hour(s))  SARS Coronavirus 2 by RT PCR (hospital order, performed in [facility] hospital lab) Nasopharyngeal Nasopharyngeal Swab     Status: Abnormal   Collection Time: 12/14/19  4:39 AM   Specimen: Nasopharyngeal Swab  Result Value Ref Range Status   SARS Coronavirus 2 POSITIVE (A) NEGATIVE Final    Comment: RESULT CALLED TO, READ BACK BY AND VERIFIED WITH: GIBSON,T AT 0530 ON 12/14/19 BY HUFFINES,S  (NOTE) SARS-CoV-2 target nucleic acids are DETECTED  SARS-CoV-2 RNA is generally detectable in upper respiratory specimens  during the acute phase of infection.  Positive results are indicative  of the presence of the identified virus, but do not rule out bacterial infection or co-infection with other pathogens not detected by the test.  Clinical correlation with patient history and  other diagnostic information is necessary to determine patient infection status.  The expected result is negative.  Fact Sheet for Patients:   StrictlyIdeas.no   Fact Sheet for Healthcare Providers:   BankingDealers.co.za    This test is not yet approved or cleared by the [place] FDA and  has been authorized for detection and/or diagnosis of SARS-CoV-2 by FDA under an Emergency Use Authorization (EUA).  This EUA will remain in effect (meanin g this test can be used) for the duration of  the COVID-19 declaration under Section 564(b)(1) of the Act, 21 U.S.C. section 360-bbb-3(b)(1), unless the authorization is terminated or revoked sooner.  Performed at [facility], [address] [place], [place] [address]   Blood Culture (routine x 2)     Status: None (Preliminary result)    Collection Time: 12/17/19  5:50 PM   Specimen: Left Antecubital; Blood  Result Value Ref  Range Status   Specimen Description LEFT ANTECUBITAL  Final   Special Requests   Final    BOTTLES DRAWN AEROBIC AND ANAEROBIC Blood Culture adequate volume   Culture   Final    NO GROWTH 2 DAYS Performed at [facility], [address], [place], [place] [address]    Report Status PENDING  Incomplete  Blood Culture (routine x 2)     Status: None (Preliminary result)   Collection Time: 12/17/19  5:58 PM   Specimen: BLOOD LEFT ARM  Result Value Ref Range Status   Specimen Description BLOOD LEFT ARM  Final   Special Requests   Final    BOTTLES DRAWN AEROBIC AND ANAEROBIC Blood Culture adequate volume   Culture   Final    NO GROWTH 2 DAYS Performed at [facility], [address], [place], [place] [address]    Report Status PENDING  Incomplete    Radiology Reports DG Chest Port 1 View  Result Date: 12/17/2019 CLINICAL DATA:  COVID pneumonia, dyspnea EXAM: PORTABLE CHEST 1 VIEW COMPARISON:  12/14/2019 FINDINGS: In lung volumes are small, but are symmetric and are stable since prior examination. There has, however, developed progressive pulmonary infiltrates within the mid and lower lung zones bilaterally, particularly within the right mid lung zone, in keeping with changes of progressive bronchopneumonia. No pneumothorax or pleural effusion. Cardiac size within normal limits. No acute bone abnormality. IMPRESSION: Progressive pulmonary infiltrates in keeping with multifocal bronchopneumonia. Electronically Signed   By: Fidela Salisbury MD   On: 12/17/2019 17:38   DG Chest Portable 1 View  Result Date: 12/14/2019 CLINICAL DATA:  Patient collapsed.  Wife is COVID-19 positive. EXAM: PORTABLE CHEST 1 VIEW COMPARISON:  08/24/2017. FINDINGS: Mediastinum and hilar structures normal. Heart size normal. Low lung volumes. Mild bibasilar and or scarring again noted. Mild left base infiltrate cannot be excluded. Small  left pleural effusion cannot be excluded. No pneumothorax. Degenerative change thoracic spine. IMPRESSION: Mild bibasilar atelectasis and or scarring again noted. Mild left base infiltrate cannot be excluded. Small left pleural effusion cannot be excluded. Electronically Signed   By: Marcello Moores  Register   On: 12/14/2019 05:26     CBC Recent Labs  Lab 12/14/19 0451 12/17/19 1742 12/18/19 0645 12/19/19 0452  WBC 8.6 7.3 4.0 10.6*  HGB 15.9 15.2 15.1 14.3  HCT 48.7 46.6 46.8 43.8  PLT 110* 117* 116* 144*  MCV 91.2 89.4 90.5 89.6  MCH 29.8 29.2 29.2 29.2  MCHC 32.6 32.6 32.3 32.6  RDW 13.5 13.5 13.4 13.5  LYMPHSABS 0.7 0.6* 0.5* 0.7  MONOABS 0.7 0.4 0.1 0.4  EOSABS 0.0 0.0 0.0 0.0  BASOSABS 0.0 0.0 0.0 0.0    Chemistries  Recent Labs  Lab 12/14/19 0451 12/17/19 1742 12/18/19 0645 12/19/19 0452  NA 135 132* 131* 138  K 4.4 4.2 6.0* 5.0  CL 98 96* 101 100  CO2 26 23 19* 24  GLUCOSE 195* 265* 353* 334*  BUN 22 34* 37* 34*  CREATININE 1.45* 1.67* 1.48* 1.25*  CALCIUM 9.0 8.3* 7.6* 8.0*  MG  --   --  2.5* 2.4  AST 56* 50* 48* 38  ALT 57* 42 38 36  ALKPHOS 74 62 59 61  BILITOT 1.0 1.3* 0.9 0.9   ------------------------------------------------------------------------------------------------------------------ Recent Labs    12/17/19 1742  TRIG 58    Lab Results  Component Value Date   HGBA1C 8.9 (H) 12/18/2019   ------------------------------------------------------------------------------------------------------------------ No results for input(s): TSH, T4TOTAL, T3FREE, THYROIDAB in the last 72  hours.  Invalid input(s): FREET3 ------------------------------------------------------------------------------------------------------------------ Recent Labs    12/18/19 0646 12/19/19 0452  FERRITIN 668* 48    Coagulation profile No results for input(s): INR, PROTIME in the last 168 hours.  Recent Labs    12/18/19 0645 12/19/19 0452  DDIMER 0.72* 0.70*     Cardiac Enzymes No results for input(s): CKMB, TROPONINI, MYOGLOBIN in the last 168 hours.  Invalid input(s): CK ------------------------------------------------------------------------------------------------------------------    Component Value Date/Time   BNP 50.0 08/24/2017 0750     Roxan Hockey M.D on 12/19/2019 at 6:15 PM  Go to [email or web address] - for contact info  [facility] - Office  [phone]

## 2019-12-19 NOTE — Progress Notes (Signed)
CRITICAL VALUE ALERT  Critical Value:  CBG 474  Date & Time Notied:  12/19/2019, 1130  Provider Notified: Dr Denton Brick  Orders Received/Actions taken:    One-time dose of 28 units of NovoLog and 20 additional units of Lantus given.

## 2019-12-20 ENCOUNTER — Inpatient Hospital Stay (HOSPITAL_COMMUNITY): Payer: Medicare PPO

## 2019-12-20 DIAGNOSIS — I471 Supraventricular tachycardia, unspecified: Secondary | ICD-10-CM

## 2019-12-20 DIAGNOSIS — Z6841 Body Mass Index (BMI) 40.0 and over, adult: Secondary | ICD-10-CM

## 2019-12-20 DIAGNOSIS — J1282 Pneumonia due to coronavirus disease 2019: Secondary | ICD-10-CM

## 2019-12-20 LAB — CBC WITH DIFFERENTIAL/PLATELET
Abs Immature Granulocytes: 0.2 10*3/uL — ABNORMAL HIGH (ref 0.00–0.07)
Basophils Absolute: 0 10*3/uL (ref 0.0–0.1)
Basophils Relative: 0 %
Eosinophils Absolute: 0 10*3/uL (ref 0.0–0.5)
Eosinophils Relative: 0 %
HCT: 50.2 % (ref 39.0–52.0)
Hemoglobin: 16.1 g/dL (ref 13.0–17.0)
Immature Granulocytes: 1 %
Lymphocytes Relative: 5 %
Lymphs Abs: 1.1 10*3/uL (ref 0.7–4.0)
MCH: 29 pg (ref 26.0–34.0)
MCHC: 32.1 g/dL (ref 30.0–36.0)
MCV: 90.5 fL (ref 80.0–100.0)
Monocytes Absolute: 1.1 10*3/uL — ABNORMAL HIGH (ref 0.1–1.0)
Monocytes Relative: 6 %
Neutro Abs: 17.2 10*3/uL — ABNORMAL HIGH (ref 1.7–7.7)
Neutrophils Relative %: 88 %
Platelets: 234 10*3/uL (ref 150–400)
RBC: 5.55 MIL/uL (ref 4.22–5.81)
RDW: 13.5 % (ref 11.5–15.5)
WBC: 19.6 10*3/uL — ABNORMAL HIGH (ref 4.0–10.5)
nRBC: 0 % (ref 0.0–0.2)

## 2019-12-20 LAB — COMPREHENSIVE METABOLIC PANEL
ALT: 36 U/L (ref 0–44)
AST: 36 U/L (ref 15–41)
Albumin: 3 g/dL — ABNORMAL LOW (ref 3.5–5.0)
Alkaline Phosphatase: 84 U/L (ref 38–126)
Anion gap: 16 — ABNORMAL HIGH (ref 5–15)
BUN: 32 mg/dL — ABNORMAL HIGH (ref 8–23)
CO2: 25 mmol/L (ref 22–32)
Calcium: 8.5 mg/dL — ABNORMAL LOW (ref 8.9–10.3)
Chloride: 98 mmol/L (ref 98–111)
Creatinine, Ser: 1.25 mg/dL — ABNORMAL HIGH (ref 0.61–1.24)
GFR calc Af Amer: >60 mL/min (ref >60)
GFR calc non Af Amer: 58 mL/min — ABNORMAL LOW (ref >60)
Glucose, Bld: 354 mg/dL — ABNORMAL HIGH (ref 70–99)
Potassium: 4.8 mmol/L (ref 3.5–5.1)
Sodium: 139 mmol/L (ref 135–145)
Total Bilirubin: 1 mg/dL (ref 0.3–1.2)
Total Protein: 7 g/dL (ref 6.5–8.1)

## 2019-12-20 LAB — GLUCOSE, RANDOM: Glucose, Bld: 427 mg/dL — ABNORMAL HIGH (ref 70–99)

## 2019-12-20 LAB — ECHOCARDIOGRAM COMPLETE
Area-P 1/2: 4.77 cm2
Height: 68 in
S' Lateral: 2.87 cm
Weight: 5347.48 oz

## 2019-12-20 LAB — GLUCOSE, CAPILLARY
Glucose-Capillary: 361 mg/dL — ABNORMAL HIGH (ref 70–99)
Glucose-Capillary: 403 mg/dL — ABNORMAL HIGH (ref 70–99)
Glucose-Capillary: 413 mg/dL — ABNORMAL HIGH (ref 70–99)
Glucose-Capillary: 306 mg/dL — ABNORMAL HIGH (ref 70–99)

## 2019-12-20 LAB — FERRITIN: Ferritin: 47 ng/mL (ref 24–336)

## 2019-12-20 LAB — D-DIMER, QUANTITATIVE: D-Dimer, Quant: 1 ug/mL-FEU — ABNORMAL HIGH (ref 0.00–0.50)

## 2019-12-20 LAB — PROCALCITONIN: Procalcitonin: 0.11 ng/mL

## 2019-12-20 LAB — MAGNESIUM: Magnesium: 2.3 mg/dL (ref 1.7–2.4)

## 2019-12-20 LAB — C-REACTIVE PROTEIN: CRP: 3.5 mg/dL — ABNORMAL HIGH (ref <1.0)

## 2019-12-20 LAB — BRAIN NATRIURETIC PEPTIDE: B Natriuretic Peptide: 130 pg/mL — ABNORMAL HIGH (ref 0.0–100.0)

## 2019-12-20 MED ORDER — FUROSEMIDE 10 MG/ML IJ SOLN
20.0000 mg | Freq: Once | INTRAMUSCULAR | Status: AC
  Administered 2019-12-20: 20 mg via INTRAVENOUS
  Filled 2019-12-20: qty 2

## 2019-12-20 MED ORDER — DILTIAZEM HCL-DEXTROSE 125-5 MG/125ML-% IV SOLN (PREMIX)
5.0000 mg/h | INTRAVENOUS | Status: DC
  Administered 2019-12-20: 5 mg/h via INTRAVENOUS
  Filled 2019-12-20: qty 250

## 2019-12-20 MED ORDER — CHLORHEXIDINE GLUCONATE CLOTH 2 % EX PADS
6.0000 | MEDICATED_PAD | Freq: Every day | CUTANEOUS | Status: DC
  Administered 2019-12-20 – 2019-12-25 (×5): 6 via TOPICAL

## 2019-12-20 MED ORDER — BARICITINIB 2 MG PO TABS: 4.0000 mg | ORAL_TABLET | Freq: Every day | ORAL | Status: DC

## 2019-12-20 MED ORDER — INSULIN ASPART 100 UNIT/ML ~~LOC~~ SOLN
6.0000 [IU] | Freq: Three times a day (TID) | SUBCUTANEOUS | Status: DC
  Administered 2019-12-20 – 2019-12-21 (×3): 6 [IU] via SUBCUTANEOUS

## 2019-12-20 MED ORDER — IPRATROPIUM-ALBUTEROL 20-100 MCG/ACT IN AERS: 1.0000 | INHALATION_SPRAY | Freq: Four times a day (QID) | RESPIRATORY_TRACT | Status: DC

## 2019-12-20 MED ORDER — INSULIN ASPART 100 UNIT/ML ~~LOC~~ SOLN
28.0000 [IU] | Freq: Once | SUBCUTANEOUS | Status: AC
  Administered 2019-12-20: 28 [IU] via SUBCUTANEOUS

## 2019-12-20 MED ORDER — INSULIN GLARGINE 100 UNIT/ML ~~LOC~~ SOLN
60.0000 [IU] | Freq: Every day | SUBCUTANEOUS | Status: DC
  Administered 2019-12-20: 60 [IU] via SUBCUTANEOUS
  Filled 2019-12-20 (×2): qty 0.6

## 2019-12-20 MED ORDER — METHYLPREDNISOLONE SODIUM SUCC 125 MG IJ SOLR
125.0000 mg | Freq: Two times a day (BID) | INTRAMUSCULAR | Status: DC
  Administered 2019-12-20 – 2019-12-25 (×10): 125 mg via INTRAVENOUS
  Filled 2019-12-20 (×9): qty 2

## 2019-12-20 MED ORDER — ALBUTEROL SULFATE HFA 108 (90 BASE) MCG/ACT IN AERS
2.0000 | INHALATION_SPRAY | Freq: Four times a day (QID) | RESPIRATORY_TRACT | Status: DC
  Administered 2019-12-20 – 2019-12-23 (×11): 2 via RESPIRATORY_TRACT

## 2019-12-20 MED ORDER — DILTIAZEM LOAD VIA INFUSION
10.0000 mg | Freq: Once | INTRAVENOUS | Status: AC
  Administered 2019-12-20: 10 mg via INTRAVENOUS
  Filled 2019-12-20: qty 10

## 2019-12-20 NOTE — Progress Notes (Signed)
*  PRELIMINARY RESULTS* Echocardiogram 2D Echocardiogram has been performed with Definity.  Samuel Germany 12/20/2019, 5:08 PM

## 2019-12-20 NOTE — Progress Notes (Signed)
PROGRESS NOTE  [name] EHO:[id number] DOB: [birth year] DOA: 12/17/2019 PCP: Sharilyn Sites, MD  Brief History:  69 y.o.malewith medical history significant fortype 2 diabetes, prior tobacco abuse,Morbid obesity, HTN, HLD admitted on 12/17/19 with Covid PNA with Hypoxia--- Pt was previously Vaccinated against Covid 20 (Moderna)  Assessment/Plan: Acute hypoxic respiratory failure secondary to COVID-19 Pneumonia -treatment plan and use of medications for treatment of COVID-19 infectionand possible side effects were discussed with patient/family -9/8 more hypoxic--increase to 12L HFNC -Patient is positive for COVID-19 infection, chest x-ray with findings of infiltrates/opacities,  patient is tachypneic/hypoxic and requiring continuous supplemental oxygen- -continue Remdesivir AND Steroid therapy per protocol  Ferritin 530>> 668>> 48>>47 CRP 15.0 >> 14.6>>7.5>>3.5 D-Dimer 0.69 >> 0.72>>0.70>1.00 WBC --4.0>> 10.6 (steroids) Fibrinogen 196 PCT 0.72---treated with azithromycin and Rocephin --- Encourage prone positioning for More than 16 hours/day in increments of 2 to 3 hours at a time if able to tolerate --Attempt to maintain euvolemic state --Zinc and vitamin C as ordered -start combivent -Enhanced dosage of anticoagulant for DVT prophylaxis given hypercoagulable state with COVID-19 infections -Continue Remdesivir and Steroids started on 12/17/2019 -increase solumedrol  SVT -9/8 personally reviewed EKG--SVT, nonspecific STT changes -TSH -Echo -personally reviewed EKG--SVT, nonspecific STT changes -start diltiazem drip  HyperKalemia- -- potassium is down after lokelma  CKD-- IIIa -baseline creatinine 1.2-1.4 ---  renally adjust medications, avoid nephrotoxic agents / dehydration  / hypotension  DM2 uncontrolled with hyperglycemia-- - A1c 8.9, reflecting uncontrolled diabetes with hyperglycemia  --hold Actos, Metformin , glipizide -Increase Lantus to 60  units -add novolog 6 units with meals  Morbid Obesity -this complicates overall care and increases morbidity and mortality -lifestyle modification -Body mass index is 48.73 kg/m.      Status is: Inpatient  Remains inpatient appropriate because:IV treatments appropriate due to intensity of illness or inability to take PO   Dispo: The patient is from: Home              Anticipated d/c is to: Home              Anticipated d/c date is: 2 days              Patient currently is not medically stable to d/c.        Family Communication:  no Family at bedside  Consultants:  none  Code Status:  FULL   DVT Prophylaxis: Wallace Lovenox   Procedures: As Listed in Progress Note Above  Antibiotics: Ceftriaxone 9/5>> azithro 9/5   The patient is critically ill with multiple organ systems failure and requires high complexity decision making for assessment and support, frequent evaluation and titration of therapies, application of advanced monitoring technologies and extensive interpretation of multiple databases.  Critical care time - 40 mins.    Subjective: Patient denies fevers, chills, headache, chest pain, dyspnea, nausea, vomiting, diarrhea, abdominal pain, dysuria, hematuria, hematochezia, and melena.   Objective: Vitals:   12/20/19 0449 12/20/19 0715 12/20/19 1107 12/20/19 1311  BP: (!) 165/98   (!) 132/97  Pulse: 85   (!) 160  Resp: 18   20  Temp: 97.8 F (36.6 C)   97.6 F (36.4 C)  TempSrc:    Oral  SpO2: (!) 86% (!) 88% (!) 86% (!) 86%  Weight:      Height:        Intake/Output Summary (Last 24 hours) at 12/20/2019 1353 Last data filed at 12/20/2019 0200 Gross per 24 hour  Intake --  Output 800 ml  Net -800 ml   Weight change:  Exam:   General:  Pt is alert, follows commands appropriately, not in acute distress  HEENT: No icterus, No thrush, No neck mass, [place]/AT  Cardiovascular: Irregular, S1/S2, no rubs, no gallops  Respiratory: bibasilar rales.  No wheeze  Abdomen: Soft/+BS, non tender, non distended, no guarding  Extremities: No edema, No lymphangitis, No petechiae, No rashes, no synovitis   Data Reviewed: I have personally reviewed following labs and imaging studies Basic Metabolic Panel: Recent Labs  Lab 12/14/19 0451 12/14/19 0451 12/17/19 1742 12/18/19 0645 12/19/19 0452 12/20/19 0734 12/20/19 1236  NA 135  --  132* 131* 138 139  --   K 4.4  --  4.2 6.0* 5.0 4.8  --   CL 98  --  96* 101 100 98  --   CO2 26  --  23 19* 24 25  --   GLUCOSE 195*   < > 265* 353* 334* 354* 427*  BUN 22  --  34* 37* 34* 32*  --   CREATININE 1.45*  --  1.67* 1.48* 1.25* 1.25*  --   CALCIUM 9.0  --  8.3* 7.6* 8.0* 8.5*  --   MG  --   --   --  2.5* 2.4 2.3  --    < > = values in this interval not displayed.   Liver Function Tests: Recent Labs  Lab 12/14/19 0451 12/17/19 1742 12/18/19 0645 12/19/19 0452 12/20/19 0734  AST 56* 50* 48* 38 36  ALT 57* 42 38 36 36  ALKPHOS 74 62 59 61 84  BILITOT 1.0 1.3* 0.9 0.9 1.0  PROT 7.5 7.0 6.8 6.2* 7.0  ALBUMIN 3.6 3.0* 2.8* 2.6* 3.0*   No results for input(s): LIPASE, AMYLASE in the last 168 hours. No results for input(s): AMMONIA in the last 168 hours. Coagulation Profile: No results for input(s): INR, PROTIME in the last 168 hours. CBC: Recent Labs  Lab 12/14/19 0451 12/17/19 1742 12/18/19 0645 12/19/19 0452 12/20/19 0734  WBC 8.6 7.3 4.0 10.6* 19.6*  NEUTROABS 7.2 6.2 3.3 9.4* 17.2*  HGB 15.9 15.2 15.1 14.3 16.1  HCT 48.7 46.6 46.8 43.8 50.2  MCV 91.2 89.4 90.5 89.6 90.5  PLT 110* 117* 116* 144* 234   Cardiac Enzymes: No results for input(s): CKTOTAL, CKMB, CKMBINDEX, TROPONINI in the last 168 hours. BNP: Invalid input(s): POCBNP CBG: Recent Labs  Lab 12/19/19 1130 12/19/19 1632 12/19/19 2140 12/20/19 0843 12/20/19 1134  GLUCAP 474* 319* 224* 361* 403*   HbA1C: Recent Labs    12/18/19 0645  HGBA1C 8.9*   Urine analysis:    Component Value Date/Time    COLORURINE YELLOW 12/14/2019 0451   APPEARANCEUR HAZY (A) 12/14/2019 0451   LABSPEC 1.023 12/14/2019 0451   PHURINE 5.0 12/14/2019 0451   GLUCOSEU 50 (A) 12/14/2019 0451   HGBUR NEGATIVE 12/14/2019 0451   BILIRUBINUR NEGATIVE 12/14/2019 0451   KETONESUR 20 (A) 12/14/2019 0451   PROTEINUR 30 (A) 12/14/2019 0451   NITRITE NEGATIVE 12/14/2019 0451   LEUKOCYTESUR NEGATIVE 12/14/2019 0451   Sepsis Labs: @LABRCNTIP (procalcitonin:4,lacticidven:4) ) Recent Results (from the past 240 hour(s))  SARS Coronavirus 2 by RT PCR (hospital order, performed in [place] hospital lab) Nasopharyngeal Nasopharyngeal Swab     Status: Abnormal   Collection Time: 12/14/19  4:39 AM   Specimen: Nasopharyngeal Swab  Result Value Ref Range Status   SARS Coronavirus 2 POSITIVE (A) NEGATIVE Final  Comment: RESULT CALLED TO, READ BACK BY AND VERIFIED WITH: GIBSON,T AT 0530 ON 12/14/19 BY HUFFINES,S  (NOTE) SARS-CoV-2 target nucleic acids are DETECTED  SARS-CoV-2 RNA is generally detectable in upper respiratory specimens  during the acute phase of infection.  Positive results are indicative  of the presence of the identified virus, but do not rule out bacterial infection or co-infection with other pathogens not detected by the test.  Clinical correlation with patient history and  other diagnostic information is necessary to determine patient infection status.  The expected result is negative.  Fact Sheet for Patients:   StrictlyIdeas.no   Fact Sheet for Healthcare Providers:   BankingDealers.co.za    This test is not yet approved or cleared by the [place] FDA and  has been authorized for detection and/or diagnosis of SARS-CoV-2 by FDA under an Emergency Use Authorization (EUA).  This EUA will remain in effect (meanin g this test can be used) for the duration of  the COVID-19 declaration under Section 564(b)(1) of the Act, 21 U.S.C. section  360-bbb-3(b)(1), unless the authorization is terminated or revoked sooner.  Performed at [facility], [address], [place], [place] [address]   Blood Culture (routine x 2)     Status: None (Preliminary result)   Collection Time: 12/17/19  5:50 PM   Specimen: Left Antecubital; Blood  Result Value Ref Range Status   Specimen Description LEFT ANTECUBITAL  Final   Special Requests   Final    BOTTLES DRAWN AEROBIC AND ANAEROBIC Blood Culture adequate volume   Culture   Final    NO GROWTH 3 DAYS Performed at [facility], [address] [place], [place] [address]    Report Status PENDING  Incomplete  Blood Culture (routine x 2)     Status: None (Preliminary result)   Collection Time: 12/17/19  5:58 PM   Specimen: BLOOD LEFT ARM  Result Value Ref Range Status   Specimen Description BLOOD LEFT ARM  Final   Special Requests   Final    BOTTLES DRAWN AEROBIC AND ANAEROBIC Blood Culture adequate volume   Culture   Final    NO GROWTH 3 DAYS Performed at [facility], [address] [place], [place] [address]    Report Status PENDING  Incomplete     Scheduled Meds:  albuterol  2 puff Inhalation QID   vitamin C  500 mg Oral Daily   aspirin EC  81 mg Oral Daily   diltiazem  10 mg Intravenous Once   enoxaparin (LOVENOX) injection  0.5 mg/kg Subcutaneous Q24H   guaiFENesin  600 mg Oral BID   insulin aspart  0-20 Units Subcutaneous TID WC   insulin aspart  0-5 Units Subcutaneous QHS   insulin aspart  6 Units Subcutaneous TID WC   insulin glargine  60 Units Subcutaneous QHS   linagliptin  5 mg Oral Daily   methylPREDNISolone (SOLU-MEDROL) injection  0.5 mg/kg Intravenous Q12H   Followed by   Derrill Memo ON 12/27/2019] predniSONE  50 mg Oral Daily   pantoprazole  40 mg Oral Daily   vitamin B-12  500 mcg Oral Daily   zinc sulfate  220 mg Oral Daily   Continuous Infusions:  diltiazem (CARDIZEM) infusion     remdesivir 100 mg in NS 100 mL 100 mg (12/20/19 0855)     Procedures/Studies: DG Chest Port 1 View  Result Date: 12/17/2019 CLINICAL DATA:  COVID pneumonia, dyspnea EXAM: PORTABLE CHEST 1 VIEW COMPARISON:  12/14/2019 FINDINGS: In lung volumes are  small, but are symmetric and are stable since prior examination. There has, however, developed progressive pulmonary infiltrates within the mid and lower lung zones bilaterally, particularly within the right mid lung zone, in keeping with changes of progressive bronchopneumonia. No pneumothorax or pleural effusion. Cardiac size within normal limits. No acute bone abnormality. IMPRESSION: Progressive pulmonary infiltrates in keeping with multifocal bronchopneumonia. Electronically Signed   By: Fidela Salisbury MD   On: 12/17/2019 17:38   DG Chest Portable 1 View  Result Date: 12/14/2019 CLINICAL DATA:  Patient collapsed.  Wife is COVID-19 positive. EXAM: PORTABLE CHEST 1 VIEW COMPARISON:  08/24/2017. FINDINGS: Mediastinum and hilar structures normal. Heart size normal. Low lung volumes. Mild bibasilar and or scarring again noted. Mild left base infiltrate cannot be excluded. Small left pleural effusion cannot be excluded. No pneumothorax. Degenerative change thoracic spine. IMPRESSION: Mild bibasilar atelectasis and or scarring again noted. Mild left base infiltrate cannot be excluded. Small left pleural effusion cannot be excluded. Electronically Signed   By: Marcello Moores  Register   On: 12/14/2019 05:26    Orson Eva, DO  [facility]  If 7PM-7AM, please contact night-coverage [email or web address] Password [email or web address] 12/20/2019, 1:53 PM   LOS: 3 days

## 2019-12-20 NOTE — Plan of Care (Signed)

## 2019-12-20 NOTE — Progress Notes (Signed)
   12/20/19 1311  Assess: MEWS Score  Temp 97.6 F (36.4 C)  BP (!) 132/97  Pulse Rate (!) 160  Resp 20  SpO2 (!) 86 %  O2 Device Nasal Cannula  O2 Flow Rate (L/min) 6 L/min  Assess: MEWS Score  MEWS Temp 0  MEWS Systolic 0  MEWS Pulse 3  MEWS RR 0  MEWS LOC 0  MEWS Score 3  MEWS Score Color Yellow  Assess: if the MEWS score is Yellow or Red  Were vital signs taken at a resting state? Yes  Focused Assessment Change from prior assessment (see assessment flowsheet)  Early Detection of Sepsis Score *See Row Information* Medium  MEWS guidelines implemented *See Row Information* Yes  Treat  MEWS Interventions Consulted Respiratory Therapy;Escalated (See documentation below) (notified dr tat)  Pain Scale 0-10  Pain Score 0  Take Vital Signs  Increase Vital Sign Frequency  Yellow: Q 2hr X 2 then Q 4hr X 2, if remains yellow, continue Q 4hrs  Escalate  MEWS: Escalate Yellow: discuss with charge nurse/RN and consider discussing with provider and RRT  Notify: Charge Nurse/RN  Name of Charge Nurse/RN Notified Donavan Foil, RN  Date Charge Nurse/RN Notified 12/20/19  Time Charge Nurse/RN Notified 1324  Notify: Provider  Provider Name/Title dr tat  Date Provider Notified 12/20/19  Time Provider Notified 1324  Notification Type Face-to-face  Notification Reason Change in status  Response See new orders  Date of Provider Response 12/20/19  Time of Provider Response 1324

## 2019-12-21 ENCOUNTER — Inpatient Hospital Stay (HOSPITAL_COMMUNITY): Payer: Medicare PPO

## 2019-12-21 DIAGNOSIS — I471 Supraventricular tachycardia: Secondary | ICD-10-CM

## 2019-12-21 DIAGNOSIS — E1165 Type 2 diabetes mellitus with hyperglycemia: Secondary | ICD-10-CM

## 2019-12-21 LAB — COMPREHENSIVE METABOLIC PANEL
ALT: 35 U/L (ref 0–44)
AST: 35 U/L (ref 15–41)
Albumin: 2.8 g/dL — ABNORMAL LOW (ref 3.5–5.0)
Alkaline Phosphatase: 77 U/L (ref 38–126)
Anion gap: 12 (ref 5–15)
BUN: 33 mg/dL — ABNORMAL HIGH (ref 8–23)
CO2: 29 mmol/L (ref 22–32)
Calcium: 8.6 mg/dL — ABNORMAL LOW (ref 8.9–10.3)
Chloride: 99 mmol/L (ref 98–111)
Creatinine, Ser: 1.27 mg/dL — ABNORMAL HIGH (ref 0.61–1.24)
GFR calc Af Amer: >60 mL/min (ref >60)
GFR calc non Af Amer: 57 mL/min — ABNORMAL LOW (ref >60)
Glucose, Bld: 301 mg/dL — ABNORMAL HIGH (ref 70–99)
Potassium: 4.7 mmol/L (ref 3.5–5.1)
Sodium: 140 mmol/L (ref 135–145)
Total Bilirubin: 0.8 mg/dL (ref 0.3–1.2)
Total Protein: 6.5 g/dL (ref 6.5–8.1)

## 2019-12-21 LAB — PROCALCITONIN: Procalcitonin: 0.1 ng/mL

## 2019-12-21 LAB — CBC WITH DIFFERENTIAL/PLATELET
Abs Immature Granulocytes: 0.34 10*3/uL — ABNORMAL HIGH (ref 0.00–0.07)
Basophils Absolute: 0.1 10*3/uL (ref 0.0–0.1)
Basophils Relative: 0 %
Eosinophils Absolute: 0 10*3/uL (ref 0.0–0.5)
Eosinophils Relative: 0 %
HCT: 46 % (ref 39.0–52.0)
Hemoglobin: 14.6 g/dL (ref 13.0–17.0)
Immature Granulocytes: 2 %
Lymphocytes Relative: 4 %
Lymphs Abs: 0.6 10*3/uL — ABNORMAL LOW (ref 0.7–4.0)
MCH: 29 pg (ref 26.0–34.0)
MCHC: 31.7 g/dL (ref 30.0–36.0)
MCV: 91.3 fL (ref 80.0–100.0)
Monocytes Absolute: 0.8 10*3/uL (ref 0.1–1.0)
Monocytes Relative: 5 %
Neutro Abs: 14.8 10*3/uL — ABNORMAL HIGH (ref 1.7–7.7)
Neutrophils Relative %: 89 %
Platelets: 182 10*3/uL (ref 150–400)
RBC: 5.04 MIL/uL (ref 4.22–5.81)
RDW: 13.6 % (ref 11.5–15.5)
WBC: 16.6 10*3/uL — ABNORMAL HIGH (ref 4.0–10.5)
nRBC: 0 % (ref 0.0–0.2)

## 2019-12-21 LAB — GLUCOSE, CAPILLARY
Glucose-Capillary: 356 mg/dL — ABNORMAL HIGH (ref 70–99)
Glucose-Capillary: 452 mg/dL — ABNORMAL HIGH (ref 70–99)
Glucose-Capillary: 294 mg/dL — ABNORMAL HIGH (ref 70–99)
Glucose-Capillary: 237 mg/dL — ABNORMAL HIGH (ref 70–99)

## 2019-12-21 LAB — FERRITIN: Ferritin: 180 ng/mL (ref 24–336)

## 2019-12-21 LAB — C-REACTIVE PROTEIN: CRP: 2.1 mg/dL — ABNORMAL HIGH (ref <1.0)

## 2019-12-21 LAB — T4, FREE: Free T4: 1.96 ng/dL — ABNORMAL HIGH (ref 0.61–1.12)

## 2019-12-21 LAB — TSH: TSH: 0.374 u[IU]/mL (ref 0.350–4.500)

## 2019-12-21 LAB — MRSA PCR SCREENING: MRSA by PCR: NEGATIVE

## 2019-12-21 LAB — D-DIMER, QUANTITATIVE: D-Dimer, Quant: 1.22 ug/mL-FEU — ABNORMAL HIGH (ref 0.00–0.50)

## 2019-12-21 LAB — MAGNESIUM: Magnesium: 2.2 mg/dL (ref 1.7–2.4)

## 2019-12-21 MED ORDER — IOHEXOL 350 MG/ML SOLN
100.0000 mL | Freq: Once | INTRAVENOUS | Status: AC | PRN
  Administered 2019-12-21: 100 mL via INTRAVENOUS

## 2019-12-21 MED ORDER — INSULIN GLARGINE 100 UNIT/ML ~~LOC~~ SOLN
80.0000 [IU] | Freq: Every day | SUBCUTANEOUS | Status: DC
  Administered 2019-12-21 – 2019-12-24 (×4): 80 [IU] via SUBCUTANEOUS
  Filled 2019-12-21 (×5): qty 0.8

## 2019-12-21 MED ORDER — BARICITINIB 2 MG PO TABS
4.0000 mg | ORAL_TABLET | Freq: Every day | ORAL | Status: DC
  Administered 2019-12-22 – 2019-12-25 (×4): 4 mg via ORAL
  Filled 2019-12-21 (×4): qty 2

## 2019-12-21 MED ORDER — INSULIN ASPART 100 UNIT/ML ~~LOC~~ SOLN
12.0000 [IU] | Freq: Three times a day (TID) | SUBCUTANEOUS | Status: DC
  Administered 2019-12-21 – 2019-12-23 (×5): 12 [IU] via SUBCUTANEOUS

## 2019-12-21 MED ORDER — INSULIN ASPART 100 UNIT/ML ~~LOC~~ SOLN
30.0000 [IU] | Freq: Once | SUBCUTANEOUS | Status: AC
  Administered 2019-12-21: 30 [IU] via SUBCUTANEOUS

## 2019-12-21 NOTE — Plan of Care (Signed)
  Problem: Acute Rehab PT Goals(only PT should resolve) Goal: Pt Will Go Supine/Side To Sit Outcome: Progressing Flowsheets (Taken 12/21/2019 1049) Pt will go Supine/Side to Sit:  with modified independence  Independently Goal: Patient Will Transfer Sit To/From Stand Outcome: Progressing Flowsheets (Taken 12/21/2019 1049) Patient will transfer sit to/from stand:  with supervision  with modified independence Goal: Pt Will Transfer Bed To Chair/Chair To Bed Outcome: Progressing Flowsheets (Taken 12/21/2019 1049) Pt will Transfer Bed to Chair/Chair to Bed:  with supervision  with modified independence Goal: Pt Will Ambulate Outcome: Progressing Flowsheets (Taken 12/21/2019 1049) Pt will Ambulate:  50 feet  with supervision  with cane  with rolling walker   10:49 AM, 12/21/19 Lonell Grandchild, MPT Physical Therapist with [facility] [phone] office [phone] mobile phone

## 2019-12-21 NOTE — Progress Notes (Signed)
PROGRESS NOTE  [name] JSH:[id number] DOB: 24-Jul-1950 DOA: 12/17/2019 PCP: Sharilyn Sites, MD   Brief History:  69 y.o.malewith medical history significant fortype 2 diabetes, prior tobacco abuse,Morbid obesity, HTN, HLD admitted on 12/17/19 with Covid PNA with Hypoxia--- presented to the ED with complaints of some generalized weakness and hypoxemia.  He was diagnosed with Covid 3 days prior to admission and has otherwise had mild symptoms.  He states that he has been fully vaccinated with the Moderna vaccine. Pt was previously Vaccinatedagainst Covid 19 (Moderna).   He was noted to have a pulse ox reading in the 80th percentile at home which is what got his family concerned.  They then urged him to come to the ED for further evaluation.  His wife is also sick at home with Covid symptoms. On 12/20/19, the patient developed SVT with HR in 150-160 range and had an escalating amount of oxygen demand going from 4L up to 10L HFNC.  He was transferred to stepdown and placed on diltiazem drip.  He spontaneously converted back to sinus rhythm after 2 hours.  He continued to required 8-10L HFNC.  He was started on baricitinib on 12/21/19.  Assessment/Plan: Acute hypoxic respiratory failure secondary to COVID-19 Pneumonia -treatment plan and use of medicationsfor treatment of COVID-19 infectionand possible side effects were discussed with patient/family -9/8 more hypoxic--increase to 12L HFNC -Patient is positive for COVID-19 infection,  -9/8 personally reviewed chest x-ray--scattered patchy opacities -continue Remdesivir AND Steroid therapy per protocol  Ferritin 530>> 668>> 48>>47>>180 CRP 15.0 >> 14.6>>7.5>>3.5>>2.1 D-Dimer 0.69 >> 0.72>>0.70>1.00>>1.22 -Fibrinogen 196 -12/21/19 PCT--0.10 -start baricitnib -12/20/19--lasix 40 mg IV x 1 -12/21/19--CTA chest --Zinc and vitamin C as ordered -start combivent -Enhanced dosage of anticoagulant for DVT prophylaxis given hypercoagulable  state with COVID-19 infections -Continue Remdesivir and Steroids started on 12/17/2019 -finished 5 days remdesivir on 12/21/19  SVT -9/8 personally reviewed EKG--SVT, nonspecific STT changes -TSH--0.374 -Echo-EF 60-65%, no WMA -personally reviewed EKG--SVT, nonspecific STT changes -start diltiazem drip>>spontaneous converted to sinus after 2 hours  HyperKalemia- -- potassium isdown after lokelma  CKD-- IIIa -baseline creatinine 1.2-1.4 --- renally adjust medications, avoid nephrotoxic agents / dehydration / hypotension  DM2 uncontrolled with hyperglycemia-- - A1c 8.9, reflecting uncontrolled diabetes with hyperglycemia  --hold Actos, Metformin , glipizide -IncreaseLantusto 80 units -increase novolog 12 units with meals  Morbid Obesity -this complicates overall care and increases morbidity and mortality -lifestyle modification -Body mass index is 48.73 kg/m.      Status is: Inpatient  Remains inpatient appropriate because:IV treatments appropriate due to intensity of illness or inability to take PO   Dispo: The patient is from: Home  Anticipated d/c is to: Home  Anticipated d/c date is: 2 days  Patient currently is not medically stable to d/c.        Family Communication:  no Family at bedside  Consultants:  none  Code Status:  FULL   DVT Prophylaxis: Edna Lovenox   Procedures: As Listed in Progress Note Above  Antibiotics: Ceftriaxone 9/5 azithro 9/5    Subjective: Patient states he is breathing better.  Denies f/c, cp, n/v/d, abd pain, hemoptysis.  He remains sob with mild exertion  Objective: Vitals:   12/21/19 1500 12/21/19 1530 12/21/19 1600 12/21/19 1621  BP: (!) 141/47 137/72 (!) 146/58   Pulse: 77 66 74 76  Resp: 18 (!) 26 (!) 25 17  Temp:   97.6 F (36.4 C)   TempSrc:   Oral  SpO2: 90% (!) 87% 91% (!) 88%  Weight:      Height:        Intake/Output Summary (Last  24 hours) at 12/21/2019 1645 Last data filed at 12/21/2019 1144 Gross per 24 hour  Intake 1159.11 ml  Output 1650 ml  Net -490.89 ml   Weight change:  Exam:   General:  Pt is alert, follows commands appropriately, not in acute distress  HEENT: No icterus, No thrush, No neck mass, [place]/AT  Cardiovascular: RRR, S1/S2, no rubs, no gallops  Respiratory: bilateral rales. No wheeze  Abdomen: Soft/+BS, non tender, non distended, no guarding  Extremities: 1+LE edema, No lymphangitis, No petechiae, No rashes, no synovitis   Data Reviewed: I have personally reviewed following labs and imaging studies Basic Metabolic Panel: Recent Labs  Lab 12/17/19 1742 12/17/19 1742 12/18/19 0645 12/19/19 0452 12/20/19 0734 12/20/19 1236 12/21/19 0450  NA 132*  --  131* 138 139  --  140  K 4.2  --  6.0* 5.0 4.8  --  4.7  CL 96*  --  101 100 98  --  99  CO2 23  --  19* 24 25  --  29  GLUCOSE 265*   < > 353* 334* 354* 427* 301*  BUN 34*  --  37* 34* 32*  --  33*  CREATININE 1.67*  --  1.48* 1.25* 1.25*  --  1.27*  CALCIUM 8.3*  --  7.6* 8.0* 8.5*  --  8.6*  MG  --   --  2.5* 2.4 2.3  --  2.2   < > = values in this interval not displayed.   Liver Function Tests: Recent Labs  Lab 12/17/19 1742 12/18/19 0645 12/19/19 0452 12/20/19 0734 12/21/19 0450  AST 50* 48* 38 36 35  ALT 42 38 36 36 35  ALKPHOS 62 59 61 84 77  BILITOT 1.3* 0.9 0.9 1.0 0.8  PROT 7.0 6.8 6.2* 7.0 6.5  ALBUMIN 3.0* 2.8* 2.6* 3.0* 2.8*   No results for input(s): LIPASE, AMYLASE in the last 168 hours. No results for input(s): AMMONIA in the last 168 hours. Coagulation Profile: No results for input(s): INR, PROTIME in the last 168 hours. CBC: Recent Labs  Lab 12/17/19 1742 12/18/19 0645 12/19/19 0452 12/20/19 0734 12/21/19 0450  WBC 7.3 4.0 10.6* 19.6* 16.6*  NEUTROABS 6.2 3.3 9.4* 17.2* 14.8*  HGB 15.2 15.1 14.3 16.1 14.6  HCT 46.6 46.8 43.8 50.2 46.0  MCV 89.4 90.5 89.6 90.5 91.3  PLT 117* 116* 144* 234 182    Cardiac Enzymes: No results for input(s): CKTOTAL, CKMB, CKMBINDEX, TROPONINI in the last 168 hours. BNP: Invalid input(s): POCBNP CBG: Recent Labs  Lab 12/20/19 1616 12/20/19 2049 12/21/19 0727 12/21/19 1141 12/21/19 1609  GLUCAP 413* 306* 356* 452* 294*   HbA1C: No results for input(s): HGBA1C in the last 72 hours. Urine analysis:    Component Value Date/Time   COLORURINE YELLOW 12/14/2019 0451   APPEARANCEUR HAZY (A) 12/14/2019 0451   LABSPEC 1.023 12/14/2019 0451   PHURINE 5.0 12/14/2019 0451   GLUCOSEU 50 (A) 12/14/2019 0451   HGBUR NEGATIVE 12/14/2019 0451   BILIRUBINUR NEGATIVE 12/14/2019 0451   KETONESUR 20 (A) 12/14/2019 0451   PROTEINUR 30 (A) 12/14/2019 0451   NITRITE NEGATIVE 12/14/2019 0451   LEUKOCYTESUR NEGATIVE 12/14/2019 0451   Sepsis Labs: @LABRCNTIP (procalcitonin:4,lacticidven:4) ) Recent Results (from the past 240 hour(s))  SARS Coronavirus 2 by RT PCR (hospital order, performed in [facility] hospital lab) Nasopharyngeal Nasopharyngeal  Swab     Status: Abnormal   Collection Time: 12/14/19  4:39 AM   Specimen: Nasopharyngeal Swab  Result Value Ref Range Status   SARS Coronavirus 2 POSITIVE (A) NEGATIVE Final    Comment: RESULT CALLED TO, READ BACK BY AND VERIFIED WITH: GIBSON,T AT 0530 ON 12/14/19 BY HUFFINES,S  (NOTE) SARS-CoV-2 target nucleic acids are DETECTED  SARS-CoV-2 RNA is generally detectable in upper respiratory specimens  during the acute phase of infection.  Positive results are indicative  of the presence of the identified virus, but do not rule out bacterial infection or co-infection with other pathogens not detected by the test.  Clinical correlation with patient history and  other diagnostic information is necessary to determine patient infection status.  The expected result is negative.  Fact Sheet for Patients:   StrictlyIdeas.no   Fact Sheet for Healthcare Providers:    BankingDealers.co.za    This test is not yet approved or cleared by the [place] FDA and  has been authorized for detection and/or diagnosis of SARS-CoV-2 by FDA under an Emergency Use Authorization (EUA).  This EUA will remain in effect (meanin g this test can be used) for the duration of  the COVID-19 declaration under Section 564(b)(1) of the Act, 21 U.S.C. section 360-bbb-3(b)(1), unless the authorization is terminated or revoked sooner.  Performed at [facility], [address] [place], [place] [address]   Blood Culture (routine x 2)     Status: None (Preliminary result)   Collection Time: 12/17/19  5:50 PM   Specimen: Left Antecubital; Blood  Result Value Ref Range Status   Specimen Description LEFT ANTECUBITAL  Final   Special Requests   Final    BOTTLES DRAWN AEROBIC AND ANAEROBIC Blood Culture adequate volume   Culture   Final    NO GROWTH 4 DAYS Performed at [facility], [address] [place], [place] [address]    Report Status PENDING  Incomplete  Blood Culture (routine x 2)     Status: None (Preliminary result)   Collection Time: 12/17/19  5:58 PM   Specimen: BLOOD LEFT ARM  Result Value Ref Range Status   Specimen Description BLOOD LEFT ARM  Final   Special Requests   Final    BOTTLES DRAWN AEROBIC AND ANAEROBIC Blood Culture adequate volume   Culture   Final    NO GROWTH 4 DAYS Performed at [facility], [address], [place], [place] [address]    Report Status PENDING  Incomplete  MRSA PCR Screening     Status: None   Collection Time: 12/20/19  2:48 PM   Specimen: Nasal Mucosa; Nasopharyngeal  Result Value Ref Range Status   MRSA by PCR NEGATIVE NEGATIVE Final    Comment:        The GeneXpert MRSA Assay (FDA approved for NASAL specimens only), is one component of a comprehensive MRSA colonization surveillance program. It is not intended to diagnose MRSA infection nor to guide or monitor treatment for MRSA  infections. Performed at [facility], [address] [place], [place] [address]      Scheduled Meds: . albuterol  2 puff Inhalation Q6H  . vitamin C  500 mg Oral Daily  . aspirin EC  81 mg Oral Daily  . baricitinib  4 mg Oral Daily  . Chlorhexidine Gluconate Cloth  6 each Topical Daily  . enoxaparin (LOVENOX) injection  0.5 mg/kg Subcutaneous Q24H  . guaiFENesin  600 mg Oral BID  . insulin aspart  0-20  Units Subcutaneous TID WC  . insulin aspart  0-5 Units Subcutaneous QHS  . insulin aspart  6 Units Subcutaneous TID WC  . insulin glargine  60 Units Subcutaneous QHS  . linagliptin  5 mg Oral Daily  . methylPREDNISolone (SOLU-MEDROL) injection  125 mg Intravenous Q12H  . pantoprazole  40 mg Oral Daily  . vitamin B-12  500 mcg Oral Daily  . zinc sulfate  220 mg Oral Daily   Continuous Infusions: . diltiazem (CARDIZEM) infusion Stopped (12/21/19 0644)    Procedures/Studies: DG CHEST PORT 1 VIEW  Result Date: 12/20/2019 CLINICAL DATA:  COVID-19 pneumonia EXAM: PORTABLE CHEST 1 VIEW COMPARISON:  12/17/2019 FINDINGS: The lung volumes are low. The heart size is enlarged. Again noted are bilateral hazy airspace opacities which have worsened in the right upper and right lower lung zones. There is no pneumothorax. No large pleural effusion. IMPRESSION: Persistent multifocal airspace opacities which have worsened in the right upper and right lower lung zones. Electronically Signed   By: Constance Holster M.D.   On: 12/20/2019 18:10   DG Chest Port 1 View  Result Date: 12/17/2019 CLINICAL DATA:  COVID pneumonia, dyspnea EXAM: PORTABLE CHEST 1 VIEW COMPARISON:  12/14/2019 FINDINGS: In lung volumes are small, but are symmetric and are stable since prior examination. There has, however, developed progressive pulmonary infiltrates within the mid and lower lung zones bilaterally, particularly within the right mid lung zone, in keeping with changes of progressive bronchopneumonia. No pneumothorax or  pleural effusion. Cardiac size within normal limits. No acute bone abnormality. IMPRESSION: Progressive pulmonary infiltrates in keeping with multifocal bronchopneumonia. Electronically Signed   By: Fidela Salisbury MD   On: 12/17/2019 17:38   DG Chest Portable 1 View  Result Date: 12/14/2019 CLINICAL DATA:  Patient collapsed.  Wife is COVID-19 positive. EXAM: PORTABLE CHEST 1 VIEW COMPARISON:  08/24/2017. FINDINGS: Mediastinum and hilar structures normal. Heart size normal. Low lung volumes. Mild bibasilar and or scarring again noted. Mild left base infiltrate cannot be excluded. Small left pleural effusion cannot be excluded. No pneumothorax. Degenerative change thoracic spine. IMPRESSION: Mild bibasilar atelectasis and or scarring again noted. Mild left base infiltrate cannot be excluded. Small left pleural effusion cannot be excluded. Electronically Signed   By: Marcello Moores  Register   On: 12/14/2019 05:26   ECHOCARDIOGRAM COMPLETE  Result Date: 12/20/2019    ECHOCARDIOGRAM REPORT   Patient Name:   DARYL BEEHLER Date of Exam: 12/20/2019 Medical Rec #:  [id number]       Height:       68.0 in Accession #:    [id number]      Weight:       334.2 lb Date of Birth:  [birth year]       BSA:          2.542 m Patient Age:    71 years        BP:           137/62 mmHg Patient Gender: M               HR:           92 bpm. Exam Location:  [facility] Procedure: 2D Echo, Cardiac Doppler and Color Doppler Indications:    SVT (supraventricular tachycardia)  History:        Patient has prior history of Echocardiogram examinations, most                 recent 09/01/2017. Risk Factors:Diabetes and Dyslipidemia. Morbid  obesity, Pneumonia due to COVID-19 virus, SVT (supraventricular                 tachycardia).  Sonographer:    Alvino Chapel RCS Referring Phys: [phone] Chermaine Schnyder IMPRESSIONS  1. Left ventricular ejection fraction, by estimation, is 60 to 65%. The left ventricle has normal function. The left ventricle has no  regional wall motion abnormalities. There is mild left ventricular hypertrophy. Left ventricular diastolic parameters are consistent with Grade I diastolic dysfunction (impaired relaxation).  2. Right ventricular systolic function is normal. The right ventricular size is normal.  3. The mitral valve is normal in structure. No evidence of mitral valve regurgitation. No evidence of mitral stenosis. Moderate mitral annular calcification.  4. The aortic valve has an indeterminant number of cusps. There is moderate calcification of the aortic valve. There is moderate thickening of the aortic valve. Aortic valve regurgitation is not visualized. No aortic stenosis is present. FINDINGS  Left Ventricle: Left ventricular ejection fraction, by estimation, is 60 to 65%. The left ventricle has normal function. The left ventricle has no regional wall motion abnormalities. Definity contrast agent was given IV to delineate the left ventricular  endocardial borders. The left ventricular internal cavity size was normal in size. There is mild left ventricular hypertrophy. Left ventricular diastolic parameters are consistent with Grade I diastolic dysfunction (impaired relaxation). Normal left ventricular filling pressure. Right Ventricle: The right ventricular size is normal. No increase in right ventricular wall thickness. Right ventricular systolic function is normal. Left Atrium: Left atrial size was normal in size. Right Atrium: Right atrial size was normal in size. Pericardium: There is no evidence of pericardial effusion. Mitral Valve: The mitral valve is normal in structure. There is mild thickening of the mitral valve leaflet(s). There is mild calcification of the mitral valve leaflet(s). Moderate mitral annular calcification. No evidence of mitral valve regurgitation. No evidence of mitral valve stenosis. Tricuspid Valve: The tricuspid valve is normal in structure. Tricuspid valve regurgitation is not demonstrated. No  evidence of tricuspid stenosis. Aortic Valve: The aortic valve has an indeterminant number of cusps. There is moderate calcification of the aortic valve. There is moderate thickening of the aortic valve. There is moderate aortic valve annular calcification. Aortic valve regurgitation is not visualized. No aortic stenosis is present. Pulmonic Valve: The pulmonic valve was not well visualized. Pulmonic valve regurgitation is not visualized. No evidence of pulmonic stenosis. Aorta: The aortic root is normal in size and structure. Pulmonary Artery: Indeterminate PASP, inadequate TR jet. IAS/Shunts: No atrial level shunt detected by color flow Doppler.  LEFT VENTRICLE PLAX 2D LVIDd:         4.74 cm  Diastology LVIDs:         2.87 cm  LV e' medial:    7.94 cm/s LV PW:         1.13 cm  LV E/e' medial:  11.4 LV IVS:        1.33 cm  LV e' lateral:   10.10 cm/s LVOT diam:     1.70 cm  LV E/e' lateral: 8.9 LV SV:         44 LV SV Index:   17 LVOT Area:     2.27 cm  RIGHT VENTRICLE RV S prime:     12.90 cm/s TAPSE (M-mode): 2.1 cm LEFT ATRIUM           Index LA diam:      4.00 cm 1.57 cm/m LA Vol (A2C): 34.5 ml  13.57 ml/m LA Vol (A4C): 47.9 ml 18.84 ml/m  AORTIC VALVE LVOT Vmax:   101.00 cm/s LVOT Vmean:  71.300 cm/s LVOT VTI:    0.192 m  AORTA Ao Root diam: 3.50 cm MITRAL VALVE MV Area (PHT): 4.77 cm     SHUNTS MV Decel Time: 159 msec     Systemic VTI:  0.19 m MV E velocity: 90.30 cm/s   Systemic Diam: 1.70 cm MV A velocity: 128.00 cm/s MV E/A ratio:  0.71 Carlyle Dolly MD Electronically signed by Carlyle Dolly MD Signature Date/Time: 12/20/2019/5:59:05 PM    Final     Orson Eva, DO  [facility]  If 7PM-7AM, please contact night-coverage [email or web address] Password [email or web address] 12/21/2019, 4:45 PM   LOS: 4 days

## 2019-12-21 NOTE — Progress Notes (Signed)
Inpatient Diabetes Program Recommendations  AACE/ADA: New Consensus Statement on Inpatient Glycemic Control (2015)  Target Ranges:  Prepandial:   less than 140 mg/dL      Peak postprandial:   less than 180 mg/dL (1-2 hours)      Critically ill patients:  140 - 180 mg/dL   Lab Results  Component Value Date   GLUCAP 356 (H) 12/21/2019   HGBA1C 8.9 (H) 12/18/2019    Review of Glycemic Control Results for [name], [name] (MRN [id number]) as of 12/21/2019 09:38  Ref. Range 12/20/2019 11:34 12/20/2019 16:16 12/20/2019 20:49 12/21/2019 07:27  Glucose-Capillary Latest Ref Range: 70 - 99 mg/dL 403 (H) 413 (H) 306 (H) 356 (H)   Diabetes history: DM2 Outpatient Diabetes medications: Trulicity 4.5 mg Qweek (Wednesday), Glipizide XL 10 mg BID, Metformin 1000 mg BID, Actos 30 mg daily Current orders for Inpatient glycemic control: Lantus 60 units QHS, Novolog 0-20 units TID with meals, Novolog 0-5 units QHS, Novolog 6 units TID, Tradjenta 5 mg daily; Solumedrol 125 mg Q12H  Inpatient Diabetes Program Recommendations:   If steroids to remain at current dose, consider further increasing Lantus 40 units BID and increasing Novolog 8 units TID.  Thanks, Bronson Curb, MSN, RNC-OB Diabetes Coordinator [phone] (8a-5p)

## 2019-12-21 NOTE — Progress Notes (Signed)
Patient refused mdi states nurse gave his inhaler.

## 2019-12-21 NOTE — Evaluation (Signed)
Physical Therapy Evaluation Patient Details Name: [name] MRN: [id number] DOB: 1950/12/16 Today's Date: 12/21/2019   History of Present Illness  [name] is a 69 y.o. male with medical history significant for type 2 diabetes, prior tobacco abuse, obesity, dyslipidemia, and hypertension who presented to the ED with complaints of some generalized weakness and hypoxemia.  He was diagnosed with Covid 3 days ago and has otherwise had mild symptoms.  He states that he has been fully vaccinated with the Moderna vaccine. He has very minimal shortness of breath, but denies any chest pain, abdominal pain, vomiting, or diarrhea.  He denies any fevers or chills.  He was noted to have a pulse ox reading in the 80th percentile at home which is what got his family concerned.  They then urged him to come to the ED for further evaluation.  His wife is also sick at home with Covid symptoms.    Clinical Impression  Patient functioning near baseline for functional mobility and gait, limited mostly due to SpO2 dropping from 88% to 85% while on 10 LPM O2, able to take a few steps at bedside to transfer to chair and declined to attempt walking in room due to wanting to eat breakfast.  Patient tolerated sitting up in chair after therapy - RN aware.  Patient will benefit from continued physical therapy in hospital and recommended venue below to increase strength, balance, endurance for safe ADLs and gait.      Follow Up Recommendations Home health PT;Supervision for mobility/OOB;Supervision - Intermittent    Equipment Recommendations  None recommended by PT    Recommendations for Other Services       Precautions / Restrictions Precautions Precautions: Fall Restrictions Weight Bearing Restrictions: No      Mobility  Bed Mobility Overal bed mobility: Modified Independent             General bed mobility comments: increased time, HOB raised  Transfers Overall transfer level: Needs  assistance Equipment used: 1 person hand held assist Transfers: Sit to/from Stand;Stand Pivot Transfers Sit to Stand: Min guard Stand pivot transfers: Min guard       General transfer comment: slow unsteady labored movement  Ambulation/Gait Ambulation/Gait assistance: Min guard Gait Distance (Feet): 4 Feet Assistive device: 1 person hand held assist Gait Pattern/deviations: Decreased step length - right;Decreased step length - left;Decreased stride length Gait velocity: decreased   General Gait Details: limited to 4-5 slow unsteady labored steps at bedside while on 10 LPM O2 with SpO2 dropping to 85%  Stairs            Wheelchair Mobility    Modified Rankin (Stroke Patients Only)       Balance Overall balance assessment: Needs assistance Sitting-balance support: Feet supported;No upper extremity supported Sitting balance-Leahy Scale: Good Sitting balance - Comments: seated at EOB   Standing balance support: During functional activity;No upper extremity supported Standing balance-Leahy Scale: Fair Standing balance comment: without AD, has to lean on armrest of chair                             Pertinent Vitals/Pain Pain Assessment: No/denies pain    Home Living Family/patient expects to be discharged to:: Private residence   Available Help at Discharge: Family;Available 24 hours/day Type of Home: House Home Access: Stairs to enter Entrance Stairs-Rails: Left Entrance Stairs-Number of Steps: 3-4 Home Layout: Two level;Able to live on main level with bedroom/bathroom Home Equipment: Gilford Rile -  4 wheels;Electric scooter;Cane - single point;Bedside commode;Shower seat      Prior Function Level of Independence: Independent with assistive device(s)         Comments: household and short distanced [facility]     Hand Dominance        Extremity/Trunk Assessment   Upper Extremity Assessment Upper Extremity Assessment:  Overall WFL for tasks assessed    Lower Extremity Assessment Lower Extremity Assessment: Generalized weakness    Cervical / Trunk Assessment Cervical / Trunk Assessment: Normal  Communication   Communication: No difficulties  Cognition Arousal/Alertness: Awake/alert Behavior During Therapy: WFL for tasks assessed/performed;Agitated Overall Cognitive Status: Within Functional Limits for tasks assessed                                 General Comments: Patieint easily agitated due to having cold breakfast      General Comments      Exercises     Assessment/Plan    PT Assessment Patient needs continued PT services  PT Problem List Decreased strength;Decreased activity tolerance;Decreased balance;Decreased mobility       PT Treatment Interventions Balance training;Gait training;Stair training;Functional mobility training;Therapeutic activities;Therapeutic exercise;Patient/family education    PT Goals (Current goals can be found in the Care Plan section)  Acute Rehab PT Goals Patient Stated Goal: return hjome with family to assist PT Goal Formulation: With patient Time For Goal Achievement: 12/28/19 Potential to Achieve Goals: Good    Frequency Min 3X/week   Barriers to discharge        Co-evaluation               AM-PAC PT "6 Clicks" Mobility  Outcome Measure Help needed turning from your back to your side while in a flat bed without using bedrails?: None Help needed moving from lying on your back to sitting on the side of a flat bed without using bedrails?: A Little Help needed moving to and from a bed to a chair (including a wheelchair)?: A Little Help needed standing up from a chair using your arms (e.g., wheelchair or bedside chair)?: A Little Help needed to walk in hospital room?: A Little Help needed climbing 3-5 steps with a railing? : A Little 6 Click Score: 19    End of Session Equipment Utilized During Treatment: Oxygen Activity  Tolerance: Patient tolerated treatment well;Patient limited by fatigue Patient left: in chair;with call bell/phone within reach Nurse Communication: Mobility status PT Visit Diagnosis: Unsteadiness on feet (R26.81);Other abnormalities of gait and mobility (R26.89);Muscle weakness (generalized) (M62.81)    Time: 9622-2979 PT Time Calculation (min) (ACUTE ONLY): 15 min   Charges:   PT Evaluation $PT Eval Low Complexity: 1 Low PT Treatments $Therapeutic Activity: 8-22 mins        10:47 AM, 12/21/19 Lonell Grandchild, MPT Physical Therapist with [facility] 336 [phone] office [phone] mobile phone

## 2019-12-22 DIAGNOSIS — E11649 Type 2 diabetes mellitus with hypoglycemia without coma: Secondary | ICD-10-CM

## 2019-12-22 LAB — CBC WITH DIFFERENTIAL/PLATELET
Abs Immature Granulocytes: 0.63 10*3/uL — ABNORMAL HIGH (ref 0.00–0.07)
Basophils Absolute: 0.1 10*3/uL (ref 0.0–0.1)
Basophils Relative: 0 %
Eosinophils Absolute: 0 10*3/uL (ref 0.0–0.5)
Eosinophils Relative: 0 %
HCT: 45.1 % (ref 39.0–52.0)
Hemoglobin: 14.5 g/dL (ref 13.0–17.0)
Immature Granulocytes: 4 %
Lymphocytes Relative: 4 %
Lymphs Abs: 0.6 10*3/uL — ABNORMAL LOW (ref 0.7–4.0)
MCH: 28.9 pg (ref 26.0–34.0)
MCHC: 32.2 g/dL (ref 30.0–36.0)
MCV: 89.8 fL (ref 80.0–100.0)
Monocytes Absolute: 0.8 10*3/uL (ref 0.1–1.0)
Monocytes Relative: 5 %
Neutro Abs: 14.2 10*3/uL — ABNORMAL HIGH (ref 1.7–7.7)
Neutrophils Relative %: 87 %
Platelets: 190 10*3/uL (ref 150–400)
RBC: 5.02 MIL/uL (ref 4.22–5.81)
RDW: 13.5 % (ref 11.5–15.5)
WBC: 16.3 10*3/uL — ABNORMAL HIGH (ref 4.0–10.5)
nRBC: 0.1 % (ref 0.0–0.2)

## 2019-12-22 LAB — CULTURE, BLOOD (ROUTINE X 2)
Culture: NO GROWTH
Culture: NO GROWTH
Special Requests: ADEQUATE
Special Requests: ADEQUATE

## 2019-12-22 LAB — COMPREHENSIVE METABOLIC PANEL
ALT: 38 U/L (ref 0–44)
AST: 33 U/L (ref 15–41)
Albumin: 2.7 g/dL — ABNORMAL LOW (ref 3.5–5.0)
Alkaline Phosphatase: 86 U/L (ref 38–126)
Anion gap: 8 (ref 5–15)
BUN: 32 mg/dL — ABNORMAL HIGH (ref 8–23)
CO2: 29 mmol/L (ref 22–32)
Calcium: 8.2 mg/dL — ABNORMAL LOW (ref 8.9–10.3)
Chloride: 99 mmol/L (ref 98–111)
Creatinine, Ser: 1.13 mg/dL (ref 0.61–1.24)
GFR calc Af Amer: >60 mL/min (ref >60)
GFR calc non Af Amer: >60 mL/min (ref >60)
Glucose, Bld: 293 mg/dL — ABNORMAL HIGH (ref 70–99)
Potassium: 4.8 mmol/L (ref 3.5–5.1)
Sodium: 136 mmol/L (ref 135–145)
Total Bilirubin: 0.9 mg/dL (ref 0.3–1.2)
Total Protein: 6.2 g/dL — ABNORMAL LOW (ref 6.5–8.1)

## 2019-12-22 LAB — GLUCOSE, CAPILLARY
Glucose-Capillary: 339 mg/dL — ABNORMAL HIGH (ref 70–99)
Glucose-Capillary: 444 mg/dL — ABNORMAL HIGH (ref 70–99)
Glucose-Capillary: 309 mg/dL — ABNORMAL HIGH (ref 70–99)
Glucose-Capillary: 230 mg/dL — ABNORMAL HIGH (ref 70–99)

## 2019-12-22 LAB — MAGNESIUM: Magnesium: 2.3 mg/dL (ref 1.7–2.4)

## 2019-12-22 LAB — C-REACTIVE PROTEIN: CRP: 0.8 mg/dL (ref <1.0)

## 2019-12-22 LAB — BRAIN NATRIURETIC PEPTIDE: B Natriuretic Peptide: 105 pg/mL — ABNORMAL HIGH (ref 0.0–100.0)

## 2019-12-22 LAB — FERRITIN: Ferritin: 64 ng/mL (ref 24–336)

## 2019-12-22 LAB — D-DIMER, QUANTITATIVE: D-Dimer, Quant: 1.89 ug/mL-FEU — ABNORMAL HIGH (ref 0.00–0.50)

## 2019-12-22 MED ORDER — INSULIN ASPART 100 UNIT/ML ~~LOC~~ SOLN
30.0000 [IU] | Freq: Once | SUBCUTANEOUS | Status: AC
  Administered 2019-12-22: 30 [IU] via SUBCUTANEOUS

## 2019-12-22 NOTE — Care Management Important Message (Signed)
Important Message  Patient Details  Name: [name] MRN: [id number] Date of Birth: July 30, 1950   Medicare Important Message Given:  Yes - Important Message mailed due to current National Emergency     Tommy Medal 12/22/2019, 3:15 PM

## 2019-12-22 NOTE — Progress Notes (Signed)
Inpatient Diabetes Program Recommendations  AACE/ADA: New Consensus Statement on Inpatient Glycemic Control  Target Ranges:  Prepandial:   less than 140 mg/dL      Peak postprandial:   less than 180 mg/dL (1-2 hours)      Critically ill patients:  140 - 180 mg/dL  Results for BRANSYN, ADAMI (MRN [id number]) as of 12/22/2019 07:34  Ref. Range 12/22/2019 05:53  Glucose Latest Ref Range: 70 - 99 mg/dL 293 (H)    Results for SHAQUEL, CHAVOUS (MRN [id number]) as of 12/22/2019 07:34  Ref. Range 12/21/2019 07:27 12/21/2019 11:41 12/21/2019 16:09 12/21/2019 19:52  Glucose-Capillary Latest Ref Range: 70 - 99 mg/dL 356 (H) 452 (H) 294 (H) 237 (H)   Review of Glycemic Control  Diabetes history: DM2 Outpatient Diabetes medications: Trulicity 4.5 mg Qweek (Wednesday), Glipizide XL 10 mg BID, Metformin 1000 mg BID, Actos 30 mg daily Current orders for Inpatient glycemic control: Lantus 80 units QHS, Novolog 0-20 units TID with meals, Novolog 0-5 units QHS, Novolog 12 units TID with meals, Tradjenta 5 mg daily; Solumedrol 125 mg Q12H  Inpatient Diabetes Program Recommendations:    Insulin: If steroids are continued as ordered please consider adding Lantus 20 units QAM and increasing meal coverage to Novolog 20 units TID with meals.  Thanks, Barnie Alderman, RN, MSN, CDE Diabetes Coordinator Inpatient Diabetes Program [phone] (Team Pager from 8am to 5pm)

## 2019-12-22 NOTE — Progress Notes (Signed)
PROGRESS NOTE  [name] QXI:[id number] DOB: [birth year] DOA: 12/17/2019 PCP: Sharilyn Sites, MD  Brief History: 69 y.o.malewith medical history significant fortype 2 diabetes, prior tobacco abuse,Morbid obesity, HTN, HLD admitted on 12/17/19 with Covid PNA with Hypoxia--- presented to the ED with complaints of some generalized weakness and hypoxemia. He was diagnosed with Covid 3 days prior to admission and has otherwise had mild symptoms. He states that he has beenfullyvaccinated with the Modernavaccine. Pt was previously Vaccinatedagainst Covid 19 (Moderna).  He was noted to have a pulse ox reading in the 80th percentile at home which is what got his family concerned. They then urged him to come to the ED for further evaluation.His wife is also sick at home with Covid symptoms. On 12/20/19, the patient developed SVT with HR in 150-160 range and had an escalating amount of oxygen demand going from 4L up to 10L HFNC.  He was transferred to stepdown and placed on diltiazem drip.  He spontaneously converted back to sinus rhythm after 2 hours.  He continued to required 8-10L HFNC.  He was started on baricitinib on 12/21/19.  Assessment/Plan: Acute hypoxic respiratory failure secondary to COVID-19 Pneumonia -treatment plan and use of medicationsfor treatment of COVID-19 infectionand possible side effects were discussed with patient/family -9/8 more hypoxic--increase to 12L HFNC -Patient is positive for COVID-19 infection,  -9/8 personally reviewed chest x-ray--scattered patchy opacities -continueRemdesivir AND Steroid therapy per protocol  Ferritin 530>> 668>> 48>>47>>180 CRP 15.0 >> 14.6>>7.5>>3.5>>2.1 D-Dimer 0.69 >> 0.72>>0.70>1.00>>1.22 -Fibrinogen 196 -12/21/19 PCT--0.10 -start baricitnib -12/20/19--lasix 40 mg IV x 1 -12/21/19--CTA chest--neg PE; diffuse GGO --Zinc and vitamin C as ordered -continue combivent -Enhanced dosage of anticoagulant for DVT prophylaxis  given hypercoagulable state with COVID-19 infections -Continue Remdesivir and Steroids started on 12/17/2019 -finished 5 days remdesivir on 12/21/19 -pt refusing PT--I have continued to encourage patient to OOB   SVT -9/8 personally reviewed EKG--SVT, nonspecific STT changes -TSH--0.374 -Echo-EF 60-65%, no WMA -personally reviewed EKG--SVT, nonspecific STT changes -start diltiazem drip>>spontaneous converted to sinus after 2 hours  HyperKalemia- -- potassium isdownafter lokelma  CKD-- IIIa -baseline creatinine 1.2-1.4 --- renally adjust medications, avoid nephrotoxic agents / dehydration / hypotension  DM2uncontrolled with hyperglycemia-- -A1c 8.9, reflecting uncontrolled diabetes with hyperglycemia  --hold Actos, Metformin , glipizide -IncreaseLantusto80 units;  Add am lantus -increase novolog 20 units with meals  Morbid Obesity -this complicates overall care and increases morbidity and mortality -lifestyle modification -Body mass index is 48.73 kg/m.      Status is: Inpatient  Remains inpatient appropriate because:IV treatments appropriate due to intensity of illness or inability to take PO   Dispo: The patient is from:Home Anticipated d/c is KL:KJZP Anticipated d/c date is: 2 days Patient currently is not medically stable to d/c.        Family Communication:noFamily at bedside  Consultants:none  Code Status: FULL   DVT Prophylaxis: [place] Lovenox   Procedures: As Listed in Progress Note Above  Antibiotics: Ceftriaxone 9/5 azithro9/5   Subjective: Patient denies fevers, chills, headache, chest pain, dyspnea, nausea, vomiting, diarrhea, abdominal pain, dysuria, hematuria, hematochezia, and melena. Complains of sob with mild exertion  Objective: Vitals:   12/22/19 0600 12/22/19 0800 12/22/19 0911 12/22/19 1600  BP: (!) 175/72     Pulse: 85     Resp: (!) 21      Temp:  (!) 97.4 F (36.3 C)  98.3 F (36.8 C)  TempSrc:  Oral  Oral  SpO2: (!) 89%  93%   Weight:      Height:        Intake/Output Summary (Last 24 hours) at 12/22/2019 1705 Last data filed at 12/22/2019 1600 Gross per 24 hour  Intake --  Output 1800 ml  Net -1800 ml   Weight change: 0.4 kg Exam:   General:  Pt is alert, follows commands appropriately, not in acute distress  HEENT: No icterus, No thrush, No neck mass, [place]/AT  Cardiovascular: RRR, S1/S2, no rubs, no gallops  Respiratory: bibasilar rales. No wheeze  Abdomen: Soft/+BS, non tender, non distended, no guarding  Extremities: No edema, No lymphangitis, No petechiae, No rashes, no synovitis   Data Reviewed: I have personally reviewed following labs and imaging studies Basic Metabolic Panel: Recent Labs  Lab 12/18/19 0645 12/18/19 0645 12/19/19 0452 12/20/19 0734 12/20/19 1236 12/21/19 0450 12/22/19 0553  NA 131*  --  138 139  --  140 136  K 6.0*  --  5.0 4.8  --  4.7 4.8  CL 101  --  100 98  --  99 99  CO2 19*  --  24 25  --  29 29  GLUCOSE 353*   < > 334* 354* 427* 301* 293*  BUN 37*  --  34* 32*  --  33* 32*  CREATININE 1.48*  --  1.25* 1.25*  --  1.27* 1.13  CALCIUM 7.6*  --  8.0* 8.5*  --  8.6* 8.2*  MG 2.5*  --  2.4 2.3  --  2.2 2.3   < > = values in this interval not displayed.   Liver Function Tests: Recent Labs  Lab 12/18/19 0645 12/19/19 0452 12/20/19 0734 12/21/19 0450 12/22/19 0553  AST 48* 38 36 35 33  ALT 38 36 36 35 38  ALKPHOS 59 61 84 77 86  BILITOT 0.9 0.9 1.0 0.8 0.9  PROT 6.8 6.2* 7.0 6.5 6.2*  ALBUMIN 2.8* 2.6* 3.0* 2.8* 2.7*   No results for input(s): LIPASE, AMYLASE in the last 168 hours. No results for input(s): AMMONIA in the last 168 hours. Coagulation Profile: No results for input(s): INR, PROTIME in the last 168 hours. CBC: Recent Labs  Lab 12/18/19 0645 12/19/19 0452 12/20/19 0734 12/21/19 0450 12/22/19 0553  WBC 4.0 10.6* 19.6* 16.6* 16.3*   NEUTROABS 3.3 9.4* 17.2* 14.8* 14.2*  HGB 15.1 14.3 16.1 14.6 14.5  HCT 46.8 43.8 50.2 46.0 45.1  MCV 90.5 89.6 90.5 91.3 89.8  PLT 116* 144* 234 182 190   Cardiac Enzymes: No results for input(s): CKTOTAL, CKMB, CKMBINDEX, TROPONINI in the last 168 hours. BNP: Invalid input(s): POCBNP CBG: Recent Labs  Lab 12/21/19 1609 12/21/19 1952 12/22/19 0836 12/22/19 1137 12/22/19 1614  GLUCAP 294* 237* 339* 444* 309*   HbA1C: No results for input(s): HGBA1C in the last 72 hours. Urine analysis:    Component Value Date/Time   COLORURINE YELLOW 12/14/2019 0451   APPEARANCEUR HAZY (A) 12/14/2019 0451   LABSPEC 1.023 12/14/2019 0451   PHURINE 5.0 12/14/2019 0451   GLUCOSEU 50 (A) 12/14/2019 0451   HGBUR NEGATIVE 12/14/2019 0451   BILIRUBINUR NEGATIVE 12/14/2019 0451   KETONESUR 20 (A) 12/14/2019 0451   PROTEINUR 30 (A) 12/14/2019 0451   NITRITE NEGATIVE 12/14/2019 0451   LEUKOCYTESUR NEGATIVE 12/14/2019 0451   Sepsis Labs: @LABRCNTIP (procalcitonin:4,lacticidven:4) ) Recent Results (from the past 240 hour(s))  SARS Coronavirus 2 by RT PCR (hospital order, performed in [facility] hospital lab) Nasopharyngeal Nasopharyngeal Swab     Status: Abnormal  Collection Time: 12/14/19  4:39 AM   Specimen: Nasopharyngeal Swab  Result Value Ref Range Status   SARS Coronavirus 2 POSITIVE (A) NEGATIVE Final    Comment: RESULT CALLED TO, READ BACK BY AND VERIFIED WITH: GIBSON,T AT 0530 ON 12/14/19 BY HUFFINES,S  (NOTE) SARS-CoV-2 target nucleic acids are DETECTED  SARS-CoV-2 RNA is generally detectable in upper respiratory specimens  during the acute phase of infection.  Positive results are indicative  of the presence of the identified virus, but do not rule out bacterial infection or co-infection with other pathogens not detected by the test.  Clinical correlation with patient history and  other diagnostic information is necessary to determine patient infection status.  The expected  result is negative.  Fact Sheet for Patients:   StrictlyIdeas.no   Fact Sheet for Healthcare Providers:   BankingDealers.co.za    This test is not yet approved or cleared by the [place] FDA and  has been authorized for detection and/or diagnosis of SARS-CoV-2 by FDA under an Emergency Use Authorization (EUA).  This EUA will remain in effect (meanin g this test can be used) for the duration of  the COVID-19 declaration under Section 564(b)(1) of the Act, 21 U.S.C. section 360-bbb-3(b)(1), unless the authorization is terminated or revoked sooner.  Performed at [facility], [address] [place], [place] [address]   Blood Culture (routine x 2)     Status: None   Collection Time: 12/17/19  5:50 PM   Specimen: Left Antecubital; Blood  Result Value Ref Range Status   Specimen Description LEFT ANTECUBITAL  Final   Special Requests   Final    BOTTLES DRAWN AEROBIC AND ANAEROBIC Blood Culture adequate volume   Culture   Final    NO GROWTH 5 DAYS Performed at [facility], [address], [place], [place] [address]    Report Status 12/22/2019 FINAL  Final  Blood Culture (routine x 2)     Status: None   Collection Time: 12/17/19  5:58 PM   Specimen: BLOOD LEFT ARM  Result Value Ref Range Status   Specimen Description BLOOD LEFT ARM  Final   Special Requests   Final    BOTTLES DRAWN AEROBIC AND ANAEROBIC Blood Culture adequate volume   Culture   Final    NO GROWTH 5 DAYS Performed at [facility], [address] [place], [place] [address]    Report Status 12/22/2019 FINAL  Final  MRSA PCR Screening     Status: None   Collection Time: 12/20/19  2:48 PM   Specimen: Nasal Mucosa; Nasopharyngeal  Result Value Ref Range Status   MRSA by PCR NEGATIVE NEGATIVE Final    Comment:        The GeneXpert MRSA Assay (FDA approved for NASAL specimens only), is one component of a comprehensive MRSA colonization surveillance program. It is  not intended to diagnose MRSA infection nor to guide or monitor treatment for MRSA infections. Performed at [facility], [address], [place],  [address]      Scheduled Meds: . albuterol  2 puff Inhalation Q6H  . vitamin C  500 mg Oral Daily  . aspirin EC  81 mg Oral Daily  . baricitinib  4 mg Oral Daily  . Chlorhexidine Gluconate Cloth  6 each Topical Daily  . enoxaparin (LOVENOX) injection  0.5 mg/kg Subcutaneous Q24H  . guaiFENesin  600 mg Oral BID  . insulin aspart  0-20 Units Subcutaneous TID WC  . insulin aspart  0-5 Units  Subcutaneous QHS  . insulin aspart  12 Units Subcutaneous TID WC  . insulin glargine  80 Units Subcutaneous QHS  . linagliptin  5 mg Oral Daily  . methylPREDNISolone (SOLU-MEDROL) injection  125 mg Intravenous Q12H  . pantoprazole  40 mg Oral Daily  . vitamin B-12  500 mcg Oral Daily  . zinc sulfate  220 mg Oral Daily   Continuous Infusions: . diltiazem (CARDIZEM) infusion Stopped (12/21/19 4174)    Procedures/Studies: CT ANGIO CHEST PE W OR WO CONTRAST  Result Date: 12/21/2019 CLINICAL DATA:  Hypoxia and COVID pneumonia.  Weakness. EXAM: CT ANGIOGRAPHY CHEST WITH CONTRAST TECHNIQUE: Multidetector CT imaging of the chest was performed using the standard protocol during bolus administration of intravenous contrast. Multiplanar CT image reconstructions and MIPs were obtained to evaluate the vascular anatomy. CONTRAST:  116mL OMNIPAQUE IOHEXOL 350 MG/ML SOLN COMPARISON:  Radiograph yesterday, additional priors.  No prior CT. FINDINGS: Cardiovascular: There are no filling defects within the pulmonary arteries to suggest pulmonary embolus. Mild multi chamber cardiomegaly. There are coronary artery calcifications. Atherosclerosis of the thoracic aorta without dissection or acute aortic finding. Conventional branching pattern from the aortic arch. Mediastinum/Nodes: Borderline and mildly enlarged mediastinal and hilar lymph nodes. For example 15 mm lower  paratracheal node, series 5, image 97. 15 mm right hilar node series 5, image 126. There is no esophageal wall thickening. Tiny hiatal hernia. The right lobe of the thyroid gland is enlarged and extends posteriorly and but no discrete nodules are seen. No pneumomediastinum. Lungs/Pleura: Relatively diffuse ground-glass opacity throughout both lungs, geographic in the lower lobes with areas of consolidation. Underlying emphysema. There is no pneumothorax. No significant pleural effusion. Upper Abdomen: Enlarged liver with steatosis. Splenomegaly. Fatty atrophy of the pancreas. No acute upper abdominal findings. Musculoskeletal: Multilevel degenerative change in the spine. Prominent Schmorl's nodes in superior endplate of T5. Review of the MIP images confirms the above findings. IMPRESSION: 1. No pulmonary embolus. 2. Relatively diffuse ground-glass opacity throughout both lungs, geographic in the lower lobes with areas of consolidation. Findings most consistent with COVID-19 pneumonia. Parenchymal involvement is extensive. 3. Borderline and mildly enlarged mediastinal and hilar lymph nodes are likely reactive. 4. Mild emphysema. 5. Aortic atherosclerosis.  Coronary artery calcifications. 6. Hepatosplenomegaly. Hepatic steatosis. Aortic Atherosclerosis (ICD10-I70.0) and Emphysema (ICD10-J43.9). Electronically Signed   By: Keith Rake M.D.   On: 12/21/2019 18:15   DG CHEST PORT 1 VIEW  Result Date: 12/20/2019 CLINICAL DATA:  COVID-19 pneumonia EXAM: PORTABLE CHEST 1 VIEW COMPARISON:  12/17/2019 FINDINGS: The lung volumes are low. The heart size is enlarged. Again noted are bilateral hazy airspace opacities which have worsened in the right upper and right lower lung zones. There is no pneumothorax. No large pleural effusion. IMPRESSION: Persistent multifocal airspace opacities which have worsened in the right upper and right lower lung zones. Electronically Signed   By: Constance Holster M.D.   On: 12/20/2019  18:10   DG Chest Port 1 View  Result Date: 12/17/2019 CLINICAL DATA:  COVID pneumonia, dyspnea EXAM: PORTABLE CHEST 1 VIEW COMPARISON:  12/14/2019 FINDINGS: In lung volumes are small, but are symmetric and are stable since prior examination. There has, however, developed progressive pulmonary infiltrates within the mid and lower lung zones bilaterally, particularly within the right mid lung zone, in keeping with changes of progressive bronchopneumonia. No pneumothorax or pleural effusion. Cardiac size within normal limits. No acute bone abnormality. IMPRESSION: Progressive pulmonary infiltrates in keeping with multifocal bronchopneumonia. Electronically Signed   By: Cassandria Anger  Christa See MD   On: 12/17/2019 17:38   DG Chest Portable 1 View  Result Date: 12/14/2019 CLINICAL DATA:  Patient collapsed.  Wife is COVID-19 positive. EXAM: PORTABLE CHEST 1 VIEW COMPARISON:  08/24/2017. FINDINGS: Mediastinum and hilar structures normal. Heart size normal. Low lung volumes. Mild bibasilar and or scarring again noted. Mild left base infiltrate cannot be excluded. Small left pleural effusion cannot be excluded. No pneumothorax. Degenerative change thoracic spine. IMPRESSION: Mild bibasilar atelectasis and or scarring again noted. Mild left base infiltrate cannot be excluded. Small left pleural effusion cannot be excluded. Electronically Signed   By: Marcello Moores  Register   On: 12/14/2019 05:26   ECHOCARDIOGRAM COMPLETE  Result Date: 12/20/2019    ECHOCARDIOGRAM REPORT   Patient Name:   JOAOPEDRO ESCHBACH Date of Exam: 12/20/2019 Medical Rec #:  [id number]       Height:       68.0 in Accession #:    [id number]      Weight:       334.2 lb Date of Birth:  1950-08-17       BSA:          2.542 m Patient Age:    69 years        BP:           137/62 mmHg Patient Gender: M               HR:           92 bpm. Exam Location:  [facility] Procedure: 2D Echo, Cardiac Doppler and Color Doppler Indications:    SVT (supraventricular tachycardia)   History:        Patient has prior history of Echocardiogram examinations, most                 recent 09/01/2017. Risk Factors:Diabetes and Dyslipidemia. Morbid                 obesity, Pneumonia due to COVID-19 virus, SVT (supraventricular                 tachycardia).  Sonographer:    Alvino Chapel RCS Referring Phys: [phone] Tamberly [place] IMPRESSIONS  1. Left ventricular ejection fraction, by estimation, is 60 to 65%. The left ventricle has normal function. The left ventricle has no regional wall motion abnormalities. There is mild left ventricular hypertrophy. Left ventricular diastolic parameters are consistent with Grade I diastolic dysfunction (impaired relaxation).  2. Right ventricular systolic function is normal. The right ventricular size is normal.  3. The mitral valve is normal in structure. No evidence of mitral valve regurgitation. No evidence of mitral stenosis. Moderate mitral annular calcification.  4. The aortic valve has an indeterminant number of cusps. There is moderate calcification of the aortic valve. There is moderate thickening of the aortic valve. Aortic valve regurgitation is not visualized. No aortic stenosis is present. FINDINGS  Left Ventricle: Left ventricular ejection fraction, by estimation, is 60 to 65%. The left ventricle has normal function. The left ventricle has no regional wall motion abnormalities. Definity contrast agent was given IV to delineate the left ventricular  endocardial borders. The left ventricular internal cavity size was normal in size. There is mild left ventricular hypertrophy. Left ventricular diastolic parameters are consistent with Grade I diastolic dysfunction (impaired relaxation). Normal left ventricular filling pressure. Right Ventricle: The right ventricular size is normal. No increase in right ventricular wall thickness. Right ventricular systolic function is normal. Left Atrium: Left atrial size was normal in  size. Right Atrium: Right atrial size was normal  in size. Pericardium: There is no evidence of pericardial effusion. Mitral Valve: The mitral valve is normal in structure. There is mild thickening of the mitral valve leaflet(s). There is mild calcification of the mitral valve leaflet(s). Moderate mitral annular calcification. No evidence of mitral valve regurgitation. No evidence of mitral valve stenosis. Tricuspid Valve: The tricuspid valve is normal in structure. Tricuspid valve regurgitation is not demonstrated. No evidence of tricuspid stenosis. Aortic Valve: The aortic valve has an indeterminant number of cusps. There is moderate calcification of the aortic valve. There is moderate thickening of the aortic valve. There is moderate aortic valve annular calcification. Aortic valve regurgitation is not visualized. No aortic stenosis is present. Pulmonic Valve: The pulmonic valve was not well visualized. Pulmonic valve regurgitation is not visualized. No evidence of pulmonic stenosis. Aorta: The aortic root is normal in size and structure. Pulmonary Artery: Indeterminate PASP, inadequate TR jet. IAS/Shunts: No atrial level shunt detected by color flow Doppler.  LEFT VENTRICLE PLAX 2D LVIDd:         4.74 cm  Diastology LVIDs:         2.87 cm  LV e' medial:    7.94 cm/s LV PW:         1.13 cm  LV E/e' medial:  11.4 LV IVS:        1.33 cm  LV e' lateral:   10.10 cm/s LVOT diam:     1.70 cm  LV E/e' lateral: 8.9 LV SV:         44 LV SV Index:   17 LVOT Area:     2.27 cm  RIGHT VENTRICLE RV S prime:     12.90 cm/s TAPSE (M-mode): 2.1 cm LEFT ATRIUM           Index LA diam:      4.00 cm 1.57 cm/m LA Vol (A2C): 34.5 ml 13.57 ml/m LA Vol (A4C): 47.9 ml 18.84 ml/m  AORTIC VALVE LVOT Vmax:   101.00 cm/s LVOT Vmean:  71.300 cm/s LVOT VTI:    0.192 m  AORTA Ao Root diam: 3.50 cm MITRAL VALVE MV Area (PHT): 4.77 cm     SHUNTS MV Decel Time: 159 msec     Systemic VTI:  0.19 m MV E velocity: 90.30 cm/s   Systemic Diam: 1.70 cm MV A velocity: 128.00 cm/s MV E/A ratio:  0.71  Carlyle Dolly MD Electronically signed by Carlyle Dolly MD Signature Date/Time: 12/20/2019/5:59:05 PM    Final     Orson Eva, DO  [facility]  If 7PM-7AM, please contact night-coverage [email or web address] Password [email or web address] 12/22/2019, 5:05 PM   LOS: 5 days

## 2019-12-22 NOTE — Progress Notes (Signed)
Patient very irritated this morning, says he is lonely and wants someone just to sit in his room with him. Continuously calling nurses station to ask for someone to sit with him. This RN and patient's NT have been in patient's room multiple times and spent considerable time with him. Explained to patient that due to safety precautions and need to care for other patients, staff is not available to sit in his room.

## 2019-12-22 NOTE — Care Management Important Message (Deleted)
Important Message  Patient Details  Name: [name] MRN: [id number] Date of Birth: [birth year]   Medicare Important Message Given:  Yes     Tommy Medal 12/22/2019, 1:50 PM

## 2019-12-22 NOTE — Progress Notes (Signed)
PT Cancellation Note  Patient Details Name: [name] MRN: [id number] DOB: [birth year]   Cancelled Treatment:    Reason Eval/Treat Not Completed: Patient declined, no reason specified.  Patient declined getting out of bed or exercises even after much encouragement - RN notified.   1:57 PM, 12/22/19 Lonell Grandchild, MPT Physical Therapist with [facility] 336 [phone] office [phone] mobile phone

## 2019-12-22 NOTE — Progress Notes (Signed)
Notified MD of CBG [id number]

## 2019-12-22 NOTE — TOC Initial Note (Signed)
Transition of Care [facility]) - Initial/Assessment Note    Patient Details  Name: [name] MRN: [id number] Date of Birth: [birth year]  Transition of Care [facility]) CM/SW Contact:    Salome Arnt, [place] Phone Number: 12/22/2019, 3:39 PM  Clinical Narrative:  Pt admitted for acute hypoxemic respiratory failure due to COVID-19. He lives at home with his wife. At baseline, pt reports he is fairly independent with ADLs. He primarily ambulates with a cane. PT evaluated pt and recommend home health. Discussed home health services with pt who is agreeable to referral to [facility]. Per Tim with [facility], able to accept pt. TOC will keep home health updated on d/c and will monitor for possible home O2 needs.                  Expected Discharge Plan: [place] Barriers to Discharge: Continued Medical Work up   Patient Goals and CMS Choice Patient states their goals for this hospitalization and ongoing recovery are:: return home      Expected Discharge Plan and Services Expected Discharge Plan: [place] In-house Referral: Clinical Social Work   Post Acute Care Choice: [place] arrangements for the past 2 months: [place]: RN, PT [place] Agency: [facility] (formerly [employer]) Date [place]: 12/22/19 Time [place]: [place] Representative spoke with at [place]: Richrd Prime  Prior Living Arrangements/Services Living arrangements for the past 2 months: [place] Lives with:: Spouse Patient language and need for interpreter reviewed:: Yes Do you feel safe going back to the place where you live?: Yes      Need for Family Participation in Patient Care: Yes (Comment) Care giver support system in place?: Yes (comment) Current home services: DME (cane, walker) Criminal Activity/Legal Involvement Pertinent to Current Situation/Hospitalization: No -  Comment as needed  Activities of Daily Living Home Assistive Devices/Equipment: Cane (specify quad or straight), Walker (specify type) ADL Screening (condition at time of admission) Patient's cognitive ability adequate to safely complete daily activities?: Yes Is the patient deaf or have difficulty hearing?: No Does the patient have difficulty seeing, even when wearing glasses/contacts?: No Does the patient have difficulty concentrating, remembering, or making decisions?: No Patient able to express need for assistance with ADLs?: Yes Does the patient have difficulty dressing or bathing?: No Independently performs ADLs?: Yes (appropriate for developmental age) Does the patient have difficulty walking or climbing stairs?: Yes Weakness of Legs: Both Weakness of Arms/Hands: Both  Permission Sought/Granted                  Emotional Assessment   Attitude/Demeanor/Rapport: Engaged Affect (typically observed): Accepting Orientation: : Oriented to Self, Oriented to Place, Oriented to  Time, Oriented to Situation Alcohol / Substance Use: Not Applicable Psych Involvement: No (comment)  Admission diagnosis:  Acute respiratory failure with hypoxia ([place]) [J96.01] Acute hypoxemic respiratory failure due to COVID-19 ([place]) [U07.1, J96.01] COVID-19 [U07.1] Patient Active Problem List   Diagnosis Date Noted  . SVT (supraventricular tachycardia) ([place]) 12/20/2019  . Morbid obesity with BMI of 45.0-49.9, adult ([place]) 12/18/2019  . Diabetes mellitus type 2, uncontrolled ([place]) 12/18/2019  . Pneumonia due to COVID-19 virus 12/18/2019  . Acute hypoxemic respiratory failure due to COVID-19 ([place]) 12/17/2019  . Cellulitis of left lower extremity  10/18/2016  . Diabetes mellitus 10/18/2016  . Hypercholesteremia 10/18/2016  . Sleep apnea 10/18/2016   PCP:  Sharilyn Sites, MD Pharmacy:   [place], [place] - [place] [address] [place] [address] [place] [address] Phone:  [phone] Fax: [phone]     Social Determinants of Health (SDOH) Interventions    Readmission Risk Interventions No flowsheet data found.

## 2019-12-23 LAB — COMPREHENSIVE METABOLIC PANEL
ALT: 42 U/L (ref 0–44)
AST: 33 U/L (ref 15–41)
Albumin: 2.6 g/dL — ABNORMAL LOW (ref 3.5–5.0)
Alkaline Phosphatase: 84 U/L (ref 38–126)
Anion gap: 8 (ref 5–15)
BUN: 31 mg/dL — ABNORMAL HIGH (ref 8–23)
CO2: 29 mmol/L (ref 22–32)
Calcium: 8.2 mg/dL — ABNORMAL LOW (ref 8.9–10.3)
Chloride: 95 mmol/L — ABNORMAL LOW (ref 98–111)
Creatinine, Ser: 1.26 mg/dL — ABNORMAL HIGH (ref 0.61–1.24)
GFR calc Af Amer: >60 mL/min (ref >60)
GFR calc non Af Amer: 57 mL/min — ABNORMAL LOW (ref >60)
Glucose, Bld: 449 mg/dL — ABNORMAL HIGH (ref 70–99)
Potassium: 5.4 mmol/L — ABNORMAL HIGH (ref 3.5–5.1)
Sodium: 132 mmol/L — ABNORMAL LOW (ref 135–145)
Total Bilirubin: 1 mg/dL (ref 0.3–1.2)
Total Protein: 6.1 g/dL — ABNORMAL LOW (ref 6.5–8.1)

## 2019-12-23 LAB — D-DIMER, QUANTITATIVE: D-Dimer, Quant: 1.49 ug/mL-FEU — ABNORMAL HIGH (ref 0.00–0.50)

## 2019-12-23 LAB — GLUCOSE, CAPILLARY
Glucose-Capillary: 348 mg/dL — ABNORMAL HIGH (ref 70–99)
Glucose-Capillary: 417 mg/dL — ABNORMAL HIGH (ref 70–99)
Glucose-Capillary: 302 mg/dL — ABNORMAL HIGH (ref 70–99)
Glucose-Capillary: 301 mg/dL — ABNORMAL HIGH (ref 70–99)

## 2019-12-23 LAB — C-REACTIVE PROTEIN: CRP: 1.1 mg/dL — ABNORMAL HIGH (ref <1.0)

## 2019-12-23 LAB — FERRITIN: Ferritin: 439 ng/mL — ABNORMAL HIGH (ref 24–336)

## 2019-12-23 MED ORDER — INSULIN ASPART 100 UNIT/ML ~~LOC~~ SOLN: 20.0000 [IU] | Freq: Three times a day (TID) | SUBCUTANEOUS | Status: DC

## 2019-12-23 MED ORDER — SODIUM ZIRCONIUM CYCLOSILICATE 10 G PO PACK
10.0000 g | PACK | Freq: Every day | ORAL | Status: DC
  Administered 2019-12-23 – 2019-12-25 (×3): 10 g via ORAL
  Filled 2019-12-23 (×3): qty 1

## 2019-12-23 MED ORDER — INSULIN ASPART 100 UNIT/ML ~~LOC~~ SOLN
25.0000 [IU] | Freq: Three times a day (TID) | SUBCUTANEOUS | Status: DC
  Administered 2019-12-23 – 2019-12-25 (×6): 25 [IU] via SUBCUTANEOUS

## 2019-12-23 MED ORDER — INSULIN GLARGINE 100 UNIT/ML ~~LOC~~ SOLN
20.0000 [IU] | Freq: Every day | SUBCUTANEOUS | Status: DC
  Administered 2019-12-23 – 2019-12-25 (×3): 20 [IU] via SUBCUTANEOUS
  Filled 2019-12-23 (×4): qty 0.2

## 2019-12-23 MED ORDER — INSULIN ASPART 100 UNIT/ML ~~LOC~~ SOLN
10.0000 [IU] | Freq: Once | SUBCUTANEOUS | Status: AC
  Administered 2019-12-23: 10 [IU] via SUBCUTANEOUS

## 2019-12-23 NOTE — Progress Notes (Signed)
PROGRESS NOTE  Jefry Lesinski HQI:[id number] DOB: 08-Nov-1950 DOA: 12/17/2019 PCP: Sharilyn Sites, MD  Brief History: 69 y.o.malewith medical history significant fortype 2 diabetes, prior tobacco abuse,Morbid obesity, HTN, HLD admitted on 12/17/19 with Covid PNA with Hypoxia---presented to the ED with complaints of some generalized weakness and hypoxemia. He was diagnosed with Covid 3 daysprior to admissionand has otherwise had mild symptoms. He states that he has beenfullyvaccinated with the Modernavaccine.Pt was previously Vaccinatedagainst Covid 19 (Moderna).He was noted to have a pulse ox reading in the 80th percentile at home which is what got his family concerned. They then urged him to come to the ED for further evaluation.His wife is also sick at home with Covid symptoms. On 12/20/19, the patient developed SVT with HR in 150-160 range and had an escalating amount of oxygen demand going from 4L up to 10L HFNC. He was transferred to stepdown and placed on diltiazem drip. He spontaneously converted back to sinus rhythm after 2 hours. He continued to required 8-10L HFNC. He was started on baricitinib on 12/21/19.  Assessment/Plan: Acute hypoxic respiratory failure secondary to COVID-19 Pneumonia -treatment plan and use of medicationsfor treatment of COVID-19 infectionand possible side effects were discussed with patient/family -9/8 more hypoxic--increase to 12L HFNC -Patient is positive for COVID-19 infection, -9/8 personally reviewedchest x-ray--scattered patchy opacities -continueRemdesivir AND Steroid therapy per protocol  Ferritin 530>> 668>> 48>>47>>180 CRP 15.0 >> 14.6>>7.5>>3.5>>2.1 D-Dimer 0.69 >> 0.72>>0.70>1.00>>1.22 -Fibrinogen 196 -12/21/19 PCT--0.10 -start baricitnib 9/10 -12/20/19--lasix 40 mg IV x 1 -12/21/19--CTA chest--neg PE; diffuse GGO --Zinc and vitamin C as ordered -continue combivent -Enhanced dosage of anticoagulant for DVT prophylaxis  given hypercoagulable state with COVID-19 infections -Continue Remdesivir and Steroids started on 12/17/2019 -finished 5 days remdesivir on 12/21/19 -pt refusing PT--I have continued to encourage patient to OOB   SVT -9/8 personally reviewed EKG--SVT, nonspecific STT changes -TSH--0.374 -Echo-EF 60-65%, no WMA -personally reviewed EKG--SVT, nonspecific STT changes -start diltiazem drip>>spontaneous converted to sinus after 2 hours  HyperKalemia- -- repeat [place]ma  CKD-- IIIa -baseline creatinine 1.2-1.4 --- renally adjust medications, avoid nephrotoxic agents / dehydration / hypotension  DM2uncontrolled with hyperglycemia-- -A1c 8.9, reflecting uncontrolled diabetes with hyperglycemia  --hold Actos, Metformin , glipizide -IncreaseLantusto80 units;  Add am lantus -increasenovolog 25units with meals  Morbid Obesity -this complicates overall care and increases morbidity and mortality -lifestyle modification -Body mass index is 48.73 kg/m.      Status is: Inpatient  Remains inpatient appropriate because:IV treatments appropriate due to intensity of illness or inability to take PO   Dispo: The patient is from:Home Anticipated d/c is XL:KGMW Anticipated d/c date is: 2 days Patient currently is not medically stable to d/c.        Family Communication:spouse updated 9/10  Consultants:none  Code Status: FULL   DVT Prophylaxis: [place] Lovenox   Procedures: As Listed in Progress Note Above  Antibiotics: Ceftriaxone 9/5 azithro9/5     Subjective: Patient wants to go home.  He feels like he's in jail.  Denies f,c ,cp, sob, n/vd, abdpain.  Objective: Vitals:   12/23/19 1008 12/23/19 1013 12/23/19 1100 12/23/19 1200  BP:      Pulse:   60 66  Resp:   18 20  Temp:      TempSrc:      SpO2: 94% 92% 97% 91%  Weight:      Height:        Intake/Output Summary (Last 24 hours) at  12/23/2019 1328 Last  data filed at 12/23/2019 1138 Gross per 24 hour  Intake --  Output 1700 ml  Net -1700 ml   Weight change:  Exam:   General:  Pt is alert, follows commands appropriately, not in acute distress  HEENT: No icterus, No thrush, No neck mass, [place]/AT  Cardiovascular: RRR, S1/S2, no rubs, no gallops  Respiratory: bibasilar crackles. No wheeze  Abdomen: Soft/+BS, non tender, non distended, no guarding  Extremities: 1+ LE edema, No lymphangitis, No petechiae, No rashes, no synovitis   Data Reviewed: I have personally reviewed following labs and imaging studies Basic Metabolic Panel: Recent Labs  Lab 12/18/19 0645 12/18/19 0645 12/19/19 0452 12/19/19 0452 12/20/19 0734 12/20/19 1236 12/21/19 0450 12/22/19 0553 12/23/19 1059  NA 131*   < > 138  --  139  --  140 136 132*  K 6.0*   < > 5.0  --  4.8  --  4.7 4.8 5.4*  CL 101   < > 100  --  98  --  99 99 95*  CO2 19*   < > 24  --  25  --  29 29 29   GLUCOSE 353*   < > 334*   < > 354* 427* 301* 293* 449*  BUN 37*   < > 34*  --  32*  --  33* 32* 31*  CREATININE 1.48*   < > 1.25*  --  1.25*  --  1.27* 1.13 1.26*  CALCIUM 7.6*   < > 8.0*  --  8.5*  --  8.6* 8.2* 8.2*  MG 2.5*  --  2.4  --  2.3  --  2.2 2.3  --    < > = values in this interval not displayed.   Liver Function Tests: Recent Labs  Lab 12/19/19 0452 12/20/19 0734 12/21/19 0450 12/22/19 0553 12/23/19 1059  AST 38 36 35 33 33  ALT 36 36 35 38 42  ALKPHOS 61 84 77 86 84  BILITOT 0.9 1.0 0.8 0.9 1.0  PROT 6.2* 7.0 6.5 6.2* 6.1*  ALBUMIN 2.6* 3.0* 2.8* 2.7* 2.6*   No results for input(s): LIPASE, AMYLASE in the last 168 hours. No results for input(s): AMMONIA in the last 168 hours. Coagulation Profile: No results for input(s): INR, PROTIME in the last 168 hours. CBC: Recent Labs  Lab 12/18/19 0645 12/19/19 0452 12/20/19 0734 12/21/19 0450 12/22/19 0553  WBC 4.0 10.6* 19.6* 16.6* 16.3*  NEUTROABS 3.3 9.4* 17.2* 14.8* 14.2*  HGB 15.1  14.3 16.1 14.6 14.5  HCT 46.8 43.8 50.2 46.0 45.1  MCV 90.5 89.6 90.5 91.3 89.8  PLT 116* 144* 234 182 190   Cardiac Enzymes: No results for input(s): CKTOTAL, CKMB, CKMBINDEX, TROPONINI in the last 168 hours. BNP: Invalid input(s): POCBNP CBG: Recent Labs  Lab 12/22/19 1137 12/22/19 1614 12/22/19 2117 12/23/19 0814 12/23/19 1135  GLUCAP 444* 309* 230* 348* 417*   HbA1C: No results for input(s): HGBA1C in the last 72 hours. Urine analysis:    Component Value Date/Time   COLORURINE YELLOW 12/14/2019 0451   APPEARANCEUR HAZY (A) 12/14/2019 0451   LABSPEC 1.023 12/14/2019 0451   PHURINE 5.0 12/14/2019 0451   GLUCOSEU 50 (A) 12/14/2019 0451   HGBUR NEGATIVE 12/14/2019 0451   BILIRUBINUR NEGATIVE 12/14/2019 0451   KETONESUR 20 (A) 12/14/2019 0451   PROTEINUR 30 (A) 12/14/2019 0451   NITRITE NEGATIVE 12/14/2019 0451   LEUKOCYTESUR NEGATIVE 12/14/2019 0451   Sepsis Labs: @LABRCNTIP (procalcitonin:4,lacticidven:4) ) Recent Results (from the past 240 hour(s))  SARS Coronavirus 2 by RT PCR (hospital order, performed in [facility] hospital lab) Nasopharyngeal Nasopharyngeal Swab     Status: Abnormal   Collection Time: 12/14/19  4:39 AM   Specimen: Nasopharyngeal Swab  Result Value Ref Range Status   SARS Coronavirus 2 POSITIVE (A) NEGATIVE Final    Comment: RESULT CALLED TO, READ BACK BY AND VERIFIED WITH: GIBSON,T AT 0530 ON 12/14/19 BY HUFFINES,S  (NOTE) SARS-CoV-2 target nucleic acids are DETECTED  SARS-CoV-2 RNA is generally detectable in upper respiratory specimens  during the acute phase of infection.  Positive results are indicative  of the presence of the identified virus, but do not rule out bacterial infection or co-infection with other pathogens not detected by the test.  Clinical correlation with patient history and  other diagnostic information is necessary to determine patient infection status.  The expected result is negative.  Fact Sheet for Patients:    StrictlyIdeas.no   Fact Sheet for Healthcare Providers:   BankingDealers.co.za    This test is not yet approved or cleared by the [place] FDA and  has been authorized for detection and/or diagnosis of SARS-CoV-2 by FDA under an Emergency Use Authorization (EUA).  This EUA will remain in effect (meanin g this test can be used) for the duration of  the COVID-19 declaration under Section 564(b)(1) of the Act, 21 U.S.C. section 360-bbb-3(b)(1), unless the authorization is terminated or revoked sooner.  Performed at [facility], [address], [place], [place] [address]   Blood Culture (routine x 2)     Status: None   Collection Time: 12/17/19  5:50 PM   Specimen: Left Antecubital; Blood  Result Value Ref Range Status   Specimen Description LEFT ANTECUBITAL  Final   Special Requests   Final    BOTTLES DRAWN AEROBIC AND ANAEROBIC Blood Culture adequate volume   Culture   Final    NO GROWTH 5 DAYS Performed at [facility], [address], [place], [place] [address]    Report Status 12/22/2019 FINAL  Final  Blood Culture (routine x 2)     Status: None   Collection Time: 12/17/19  5:58 PM   Specimen: BLOOD LEFT ARM  Result Value Ref Range Status   Specimen Description BLOOD LEFT ARM  Final   Special Requests   Final    BOTTLES DRAWN AEROBIC AND ANAEROBIC Blood Culture adequate volume   Culture   Final    NO GROWTH 5 DAYS Performed at [facility], [address] [place], [place] [address]    Report Status 12/22/2019 FINAL  Final  MRSA PCR Screening     Status: None   Collection Time: 12/20/19  2:48 PM   Specimen: Nasal Mucosa; Nasopharyngeal  Result Value Ref Range Status   MRSA by PCR NEGATIVE NEGATIVE Final    Comment:        The GeneXpert MRSA Assay (FDA approved for NASAL specimens only), is one component of a comprehensive MRSA colonization surveillance program. It is not intended to diagnose MRSA infection nor to  guide or monitor treatment for MRSA infections. Performed at [facility], [address] [place], [place] [address]      Scheduled Meds: . albuterol  2 puff Inhalation Q6H  . vitamin C  500 mg Oral Daily  . aspirin EC  81 mg Oral Daily  . baricitinib  4 mg Oral Daily  . Chlorhexidine Gluconate Cloth  6 each Topical Daily  . enoxaparin (LOVENOX) injection  0.5 mg/kg Subcutaneous Q24H  .  guaiFENesin  600 mg Oral BID  . insulin aspart  0-20 Units Subcutaneous TID WC  . insulin aspart  0-5 Units Subcutaneous QHS  . insulin aspart  10 Units Subcutaneous Once  . insulin aspart  25 Units Subcutaneous TID WC  . insulin glargine  20 Units Subcutaneous Daily  . insulin glargine  80 Units Subcutaneous QHS  . linagliptin  5 mg Oral Daily  . methylPREDNISolone (SOLU-MEDROL) injection  125 mg Intravenous Q12H  . pantoprazole  40 mg Oral Daily  . vitamin B-12  500 mcg Oral Daily  . zinc sulfate  220 mg Oral Daily   Continuous Infusions: . diltiazem (CARDIZEM) infusion Stopped (12/21/19 7106)    Procedures/Studies: CT ANGIO CHEST PE W OR WO CONTRAST  Result Date: 12/21/2019 CLINICAL DATA:  Hypoxia and COVID pneumonia.  Weakness. EXAM: CT ANGIOGRAPHY CHEST WITH CONTRAST TECHNIQUE: Multidetector CT imaging of the chest was performed using the standard protocol during bolus administration of intravenous contrast. Multiplanar CT image reconstructions and MIPs were obtained to evaluate the vascular anatomy. CONTRAST:  169mL OMNIPAQUE IOHEXOL 350 MG/ML SOLN COMPARISON:  Radiograph yesterday, additional priors.  No prior CT. FINDINGS: Cardiovascular: There are no filling defects within the pulmonary arteries to suggest pulmonary embolus. Mild multi chamber cardiomegaly. There are coronary artery calcifications. Atherosclerosis of the thoracic aorta without dissection or acute aortic finding. Conventional branching pattern from the aortic arch. Mediastinum/Nodes: Borderline and mildly enlarged mediastinal  and hilar lymph nodes. For example 15 mm lower paratracheal node, series 5, image 97. 15 mm right hilar node series 5, image 126. There is no esophageal wall thickening. Tiny hiatal hernia. The right lobe of the thyroid gland is enlarged and extends posteriorly and but no discrete nodules are seen. No pneumomediastinum. Lungs/Pleura: Relatively diffuse ground-glass opacity throughout both lungs, geographic in the lower lobes with areas of consolidation. Underlying emphysema. There is no pneumothorax. No significant pleural effusion. Upper Abdomen: Enlarged liver with steatosis. Splenomegaly. Fatty atrophy of the pancreas. No acute upper abdominal findings. Musculoskeletal: Multilevel degenerative change in the spine. Prominent Schmorl's nodes in superior endplate of T5. Review of the MIP images confirms the above findings. IMPRESSION: 1. No pulmonary embolus. 2. Relatively diffuse ground-glass opacity throughout both lungs, geographic in the lower lobes with areas of consolidation. Findings most consistent with COVID-19 pneumonia. Parenchymal involvement is extensive. 3. Borderline and mildly enlarged mediastinal and hilar lymph nodes are likely reactive. 4. Mild emphysema. 5. Aortic atherosclerosis.  Coronary artery calcifications. 6. Hepatosplenomegaly. Hepatic steatosis. Aortic Atherosclerosis (ICD10-I70.0) and Emphysema (ICD10-J43.9). Electronically Signed   By: Keith Rake M.D.   On: 12/21/2019 18:15   DG CHEST PORT 1 VIEW  Result Date: 12/20/2019 CLINICAL DATA:  COVID-19 pneumonia EXAM: PORTABLE CHEST 1 VIEW COMPARISON:  12/17/2019 FINDINGS: The lung volumes are low. The heart size is enlarged. Again noted are bilateral hazy airspace opacities which have worsened in the right upper and right lower lung zones. There is no pneumothorax. No large pleural effusion. IMPRESSION: Persistent multifocal airspace opacities which have worsened in the right upper and right lower lung zones. Electronically Signed    By: Constance Holster M.D.   On: 12/20/2019 18:10   DG Chest Port 1 View  Result Date: 12/17/2019 CLINICAL DATA:  COVID pneumonia, dyspnea EXAM: PORTABLE CHEST 1 VIEW COMPARISON:  12/14/2019 FINDINGS: In lung volumes are small, but are symmetric and are stable since prior examination. There has, however, developed progressive pulmonary infiltrates within the mid and lower lung zones bilaterally, particularly within the  right mid lung zone, in keeping with changes of progressive bronchopneumonia. No pneumothorax or pleural effusion. Cardiac size within normal limits. No acute bone abnormality. IMPRESSION: Progressive pulmonary infiltrates in keeping with multifocal bronchopneumonia. Electronically Signed   By: Fidela Salisbury MD   On: 12/17/2019 17:38   DG Chest Portable 1 View  Result Date: 12/14/2019 CLINICAL DATA:  Patient collapsed.  Wife is COVID-19 positive. EXAM: PORTABLE CHEST 1 VIEW COMPARISON:  08/24/2017. FINDINGS: Mediastinum and hilar structures normal. Heart size normal. Low lung volumes. Mild bibasilar and or scarring again noted. Mild left base infiltrate cannot be excluded. Small left pleural effusion cannot be excluded. No pneumothorax. Degenerative change thoracic spine. IMPRESSION: Mild bibasilar atelectasis and or scarring again noted. Mild left base infiltrate cannot be excluded. Small left pleural effusion cannot be excluded. Electronically Signed   By: Marcello Moores  Register   On: 12/14/2019 05:26   ECHOCARDIOGRAM COMPLETE  Result Date: 12/20/2019    ECHOCARDIOGRAM REPORT   Patient Name:   NICKALOUS STINGLEY Date of Exam: 12/20/2019 Medical Rec #:  [id number]       Height:       68.0 in Accession #:    [id number]      Weight:       334.2 lb Date of Birth:  [birth year]       BSA:          2.542 m Patient Age:    88 years        BP:           137/62 mmHg Patient Gender: M               HR:           92 bpm. Exam Location:  [facility] Procedure: 2D Echo, Cardiac Doppler and Color Doppler Indications:     SVT (supraventricular tachycardia)  History:        Patient has prior history of Echocardiogram examinations, most                 recent 09/01/2017. Risk Factors:Diabetes and Dyslipidemia. Morbid                 obesity, Pneumonia due to COVID-19 virus, SVT (supraventricular                 tachycardia).  Sonographer:    Alvino Chapel RCS Referring Phys: [id number] [place] IMPRESSIONS  1. Left ventricular ejection fraction, by estimation, is 60 to 65%. The left ventricle has normal function. The left ventricle has no regional wall motion abnormalities. There is mild left ventricular hypertrophy. Left ventricular diastolic parameters are consistent with Grade I diastolic dysfunction (impaired relaxation).  2. Right ventricular systolic function is normal. The right ventricular size is normal.  3. The mitral valve is normal in structure. No evidence of mitral valve regurgitation. No evidence of mitral stenosis. Moderate mitral annular calcification.  4. The aortic valve has an indeterminant number of cusps. There is moderate calcification of the aortic valve. There is moderate thickening of the aortic valve. Aortic valve regurgitation is not visualized. No aortic stenosis is present. FINDINGS  Left Ventricle: Left ventricular ejection fraction, by estimation, is 60 to 65%. The left ventricle has normal function. The left ventricle has no regional wall motion abnormalities. Definity contrast agent was given IV to delineate the left ventricular  endocardial borders. The left ventricular internal cavity size was normal in size. There is mild left ventricular hypertrophy. Left ventricular diastolic parameters are consistent  with Grade I diastolic dysfunction (impaired relaxation). Normal left ventricular filling pressure. Right Ventricle: The right ventricular size is normal. No increase in right ventricular wall thickness. Right ventricular systolic function is normal. Left Atrium: Left atrial size was normal in size.  Right Atrium: Right atrial size was normal in size. Pericardium: There is no evidence of pericardial effusion. Mitral Valve: The mitral valve is normal in structure. There is mild thickening of the mitral valve leaflet(s). There is mild calcification of the mitral valve leaflet(s). Moderate mitral annular calcification. No evidence of mitral valve regurgitation. No evidence of mitral valve stenosis. Tricuspid Valve: The tricuspid valve is normal in structure. Tricuspid valve regurgitation is not demonstrated. No evidence of tricuspid stenosis. Aortic Valve: The aortic valve has an indeterminant number of cusps. There is moderate calcification of the aortic valve. There is moderate thickening of the aortic valve. There is moderate aortic valve annular calcification. Aortic valve regurgitation is not visualized. No aortic stenosis is present. Pulmonic Valve: The pulmonic valve was not well visualized. Pulmonic valve regurgitation is not visualized. No evidence of pulmonic stenosis. Aorta: The aortic root is normal in size and structure. Pulmonary Artery: Indeterminate PASP, inadequate TR jet. IAS/Shunts: No atrial level shunt detected by color flow Doppler.  LEFT VENTRICLE PLAX 2D LVIDd:         4.74 cm  Diastology LVIDs:         2.87 cm  LV e' medial:    7.94 cm/s LV PW:         1.13 cm  LV E/e' medial:  11.4 LV IVS:        1.33 cm  LV e' lateral:   10.10 cm/s LVOT diam:     1.70 cm  LV E/e' lateral: 8.9 LV SV:         44 LV SV Index:   17 LVOT Area:     2.27 cm  RIGHT VENTRICLE RV S prime:     12.90 cm/s TAPSE (M-mode): 2.1 cm LEFT ATRIUM           Index LA diam:      4.00 cm 1.57 cm/m LA Vol (A2C): 34.5 ml 13.57 ml/m LA Vol (A4C): 47.9 ml 18.84 ml/m  AORTIC VALVE LVOT Vmax:   101.00 cm/s LVOT Vmean:  71.300 cm/s LVOT VTI:    0.192 m  AORTA Ao Root diam: 3.50 cm MITRAL VALVE MV Area (PHT): 4.77 cm     SHUNTS MV Decel Time: 159 msec     Systemic VTI:  0.19 m MV E velocity: 90.30 cm/s   Systemic Diam: 1.70 cm MV  A velocity: 128.00 cm/s MV E/A ratio:  0.71 Carlyle Dolly MD Electronically signed by Carlyle Dolly MD Signature Date/Time: 12/20/2019/5:59:05 PM    Final     Orson Eva, DO  [facility]  If 7PM-7AM, please contact night-coverage [email or web address] Password [email or web address] 12/23/2019, 1:28 PM   LOS: 6 days

## 2019-12-23 NOTE — Progress Notes (Signed)
Assisted patient with bathing - patient did not actively participate with his care despite encouragement. Offered a gown as patient continues to wear personal shorts from home that are soiled with dried urine, patient stated he did not want to change at this time. Assisted into bed with moderate assist of 1.

## 2019-12-23 NOTE — Progress Notes (Signed)
Patient's last CBG was 417. Patient showed no signs of distress. MD notified and orders changed to 25 units of novolog three times daily with meals and a one time dose of 10 units of novolog. Will continue to monitor.

## 2019-12-24 LAB — CBC WITH DIFFERENTIAL/PLATELET
Abs Immature Granulocytes: 1.11 10*3/uL — ABNORMAL HIGH (ref 0.00–0.07)
Basophils Absolute: 0.1 10*3/uL (ref 0.0–0.1)
Basophils Relative: 1 %
Eosinophils Absolute: 0 10*3/uL (ref 0.0–0.5)
Eosinophils Relative: 0 %
HCT: 44.8 % (ref 39.0–52.0)
Hemoglobin: 14.5 g/dL (ref 13.0–17.0)
Immature Granulocytes: 7 %
Lymphocytes Relative: 4 %
Lymphs Abs: 0.7 10*3/uL (ref 0.7–4.0)
MCH: 29.1 pg (ref 26.0–34.0)
MCHC: 32.4 g/dL (ref 30.0–36.0)
MCV: 90 fL (ref 80.0–100.0)
Monocytes Absolute: 1 10*3/uL (ref 0.1–1.0)
Monocytes Relative: 6 %
Neutro Abs: 13.4 10*3/uL — ABNORMAL HIGH (ref 1.7–7.7)
Neutrophils Relative %: 82 %
Platelets: 185 10*3/uL (ref 150–400)
RBC: 4.98 MIL/uL (ref 4.22–5.81)
RDW: 13.5 % (ref 11.5–15.5)
WBC: 16.2 10*3/uL — ABNORMAL HIGH (ref 4.0–10.5)
nRBC: 0 % (ref 0.0–0.2)

## 2019-12-24 LAB — COMPREHENSIVE METABOLIC PANEL
ALT: 48 U/L — ABNORMAL HIGH (ref 0–44)
AST: 42 U/L — ABNORMAL HIGH (ref 15–41)
Albumin: 2.5 g/dL — ABNORMAL LOW (ref 3.5–5.0)
Alkaline Phosphatase: 74 U/L (ref 38–126)
Anion gap: 8 (ref 5–15)
BUN: 29 mg/dL — ABNORMAL HIGH (ref 8–23)
CO2: 31 mmol/L (ref 22–32)
Calcium: 8.6 mg/dL — ABNORMAL LOW (ref 8.9–10.3)
Chloride: 94 mmol/L — ABNORMAL LOW (ref 98–111)
Creatinine, Ser: 1.18 mg/dL (ref 0.61–1.24)
GFR calc Af Amer: >60 mL/min (ref >60)
GFR calc non Af Amer: >60 mL/min (ref >60)
Glucose, Bld: 284 mg/dL — ABNORMAL HIGH (ref 70–99)
Potassium: 5.1 mmol/L (ref 3.5–5.1)
Sodium: 133 mmol/L — ABNORMAL LOW (ref 135–145)
Total Bilirubin: 0.7 mg/dL (ref 0.3–1.2)
Total Protein: 5.8 g/dL — ABNORMAL LOW (ref 6.5–8.1)

## 2019-12-24 LAB — GLUCOSE, CAPILLARY
Glucose-Capillary: 324 mg/dL — ABNORMAL HIGH (ref 70–99)
Glucose-Capillary: 253 mg/dL — ABNORMAL HIGH (ref 70–99)
Glucose-Capillary: 162 mg/dL — ABNORMAL HIGH (ref 70–99)
Glucose-Capillary: 126 mg/dL — ABNORMAL HIGH (ref 70–99)

## 2019-12-24 LAB — FERRITIN: Ferritin: 411 ng/mL — ABNORMAL HIGH (ref 24–336)

## 2019-12-24 LAB — D-DIMER, QUANTITATIVE: D-Dimer, Quant: 1.21 ug/mL-FEU — ABNORMAL HIGH (ref 0.00–0.50)

## 2019-12-24 LAB — C-REACTIVE PROTEIN: CRP: 0.8 mg/dL (ref <1.0)

## 2019-12-24 MED ORDER — ALBUTEROL SULFATE HFA 108 (90 BASE) MCG/ACT IN AERS
2.0000 | INHALATION_SPRAY | Freq: Three times a day (TID) | RESPIRATORY_TRACT | Status: DC
  Administered 2019-12-24 – 2019-12-25 (×4): 2 via RESPIRATORY_TRACT

## 2019-12-24 NOTE — Progress Notes (Signed)
Patient had wet the bed. [name] NT.had went in to help him clean up. Patient wanted to stand to take wet garments off but became very short of breath and had difficulty standing. I arrived in room and helped him stand but patient said he was to weak to wash himself and [place] NT. Had to complete cleaning. Patient stated it was not right to make him stand, Dr. Carles Collet this morning reminded patient that he needed to move and sit as much as possible because patient wants to go home. After move to chair patients heart rate increased to 173 and in 15 minutes did not decrease. I finally placed patient back in bed heartrate stayed above 150 beats per minutes for 10 minutes before rate started to decrease. Oxygen saturations stayed above 85%,

## 2019-12-24 NOTE — Progress Notes (Signed)
PROGRESS NOTE  [name] JSH:[id number] DOB: Oct 22, 1950 DOA: 12/17/2019 PCP: Sharilyn Sites, MD  Brief History: 69 y.o.malewith medical history significant fortype 2 diabetes, prior tobacco abuse,Morbid obesity, HTN, HLD admitted on 12/17/19 with Covid PNA with Hypoxia---presented to the ED with complaints of some generalized weakness and hypoxemia. He was diagnosed with Covid 3 daysprior to admissionand has otherwise had mild symptoms. He states that he has beenfullyvaccinated with the Modernavaccine.Pt was previously Vaccinatedagainst Covid 19 (Moderna).He was noted to have a pulse ox reading in the 80th percentile at home which is what got his family concerned. They then urged him to come to the ED for further evaluation.His wife is also sick at home with Covid symptoms. On 12/20/19, the patient developed SVT with HR in 150-160 range and had an escalating amount of oxygen demand going from 4L up to 10L HFNC. He was transferred to stepdown and placed on diltiazem drip. He spontaneously converted back to sinus rhythm after 2 hours. He continued to required 8-10L HFNC. He was started on baricitinib on 12/21/19.  His improvement was slowed due to his frequent refusal to participate in his own care.  Assessment/Plan: Acute hypoxic respiratory failure secondary to COVID-19 Pneumonia -treatment plan and use of medicationsfor treatment of COVID-19 infectionand possible side effects were discussed with patient/family -9/8 more hypoxic--increase to 12L HFNC>>5L -Patient is positive for COVID-19 infection, -9/8 personally reviewedchest x-ray--scattered patchy opacities -continueRemdesivir AND Steroid therapy per protocol  Ferritin 530>> 668>> 48>>47>>180>>411 CRP 15.0 >> 14.6>>7.5>>3.5>>2.1>>0.8 D-Dimer 0.69 >> 0.72>>0.70>1.00>>1.22>>1.21 -Fibrinogen 196 -12/21/19 PCT--0.10 -started baricitnib 9/10 -12/20/19--lasix 40 mg IV x 1 -12/21/19--CTA chest--neg PE; diffuse  GGO --Zinc and vitamin C as ordered -continuecombivent -Enhanced dosage of anticoagulant for DVT prophylaxis given hypercoagulable state with COVID-19 infections -Continue Remdesivir and Steroids started on 12/17/2019 -finished 5 days remdesivir on 12/21/19 -pt refusing PT--I have continued to encourage patient to OOB  SVT -9/8 personally reviewed EKG--SVT, nonspecific STT changes -TSH--0.374 -Echo-EF 60-65%, no WMA -personally reviewed EKG--SVT, nonspecific STT changes -start diltiazem drip>>spontaneous converted to sinus after 2 hours  HyperKalemia- -- repeat Lokelma  CKD-- IIIa -baseline creatinine 1.2-1.4 --- renally adjust medications, avoid nephrotoxic agents / dehydration / hypotension  DM2uncontrolled with hyperglycemia-- -A1c 8.9, reflecting uncontrolled diabetes with hyperglycemia  --hold Actos, Metformin , glipizide -IncreaseLantusto80 units; Add am lantus -increasenovolog25units with meals  Morbid Obesity -this complicates overall care and increases morbidity and mortality -lifestyle modification -Body mass index is 48.73 kg/m.      Status is: Inpatient  Remains inpatient appropriate because:IV treatments appropriate due to intensity of illness or inability to take PO   Dispo: The patient is from:Home Anticipated d/c is IF:OYDX Anticipated d/c date is: 1-2 days Patient currently is not medically stable to d/c.        Family Communication:spouse updated 9/12  Consultants:none  Code Status: FULL   DVT Prophylaxis: [place] Lovenox   Procedures: As Listed in Progress Note Above  Antibiotics: Ceftriaxone 9/5 azithro9/5      Subjective:  Patient wants to go home.  He denies f/c, cp, n/v/d.  He has had some dyspnea on exertion.  Denies abd pain Objective: Vitals:   12/24/19 0908 12/24/19 1135 12/24/19 1454 12/24/19 1559  BP:      Pulse:  73  85  Resp:   (!) 23  (!) 23  Temp:  97.8 F (36.6 C)  98.2 F (36.8 C)  TempSrc:  Oral  Axillary  SpO2: 93% 92% 94% Marland Kitchen)  88%  Weight:      Height:        Intake/Output Summary (Last 24 hours) at 12/24/2019 1639 Last data filed at 12/24/2019 1000 Gross per 24 hour  Intake 0 ml  Output 950 ml  Net -950 ml   Weight change:  Exam:   General:  Pt is alert, follows commands appropriately, not in acute distress  HEENT: No icterus, No thrush, No neck mass, [place]/AT  Cardiovascular: RRR, S1/S2, no rubs, no gallops  Respiratory: diminished BS, bilateral rales  Abdomen: Soft/+BS, non tender, non distended, no guarding  Extremities: No edema, No lymphangitis, No petechiae, No rashes, no synovitis   Data Reviewed: I have personally reviewed following labs and imaging studies Basic Metabolic Panel: Recent Labs  Lab 12/18/19 0645 12/18/19 0645 12/19/19 0452 12/19/19 0452 12/20/19 0734 12/20/19 0734 12/20/19 1236 12/21/19 0450 12/22/19 0553 12/23/19 1059 12/24/19 0704  NA 131*   < > 138   < > 139  --   --  140 136 132* 133*  K 6.0*   < > 5.0   < > 4.8  --   --  4.7 4.8 5.4* 5.1  CL 101   < > 100   < > 98  --   --  99 99 95* 94*  CO2 19*   < > 24   < > 25  --   --  29 29 29 31   GLUCOSE 353*   < > 334*   < > 354*   < > 427* 301* 293* 449* 284*  BUN 37*   < > 34*   < > 32*  --   --  33* 32* 31* 29*  CREATININE 1.48*   < > 1.25*   < > 1.25*  --   --  1.27* 1.13 1.26* 1.18  CALCIUM 7.6*   < > 8.0*   < > 8.5*  --   --  8.6* 8.2* 8.2* 8.6*  MG 2.5*  --  2.4  --  2.3  --   --  2.2 2.3  --   --    < > = values in this interval not displayed.   Liver Function Tests: Recent Labs  Lab 12/20/19 0734 12/21/19 0450 12/22/19 0553 12/23/19 1059 12/24/19 0704  AST 36 35 33 33 42*  ALT 36 35 38 42 48*  ALKPHOS 84 77 86 84 74  BILITOT 1.0 0.8 0.9 1.0 0.7  PROT 7.0 6.5 6.2* 6.1* 5.8*  ALBUMIN 3.0* 2.8* 2.7* 2.6* 2.5*   No results for input(s): LIPASE, AMYLASE in the last 168 hours. No results  for input(s): AMMONIA in the last 168 hours. Coagulation Profile: No results for input(s): INR, PROTIME in the last 168 hours. CBC: Recent Labs  Lab 12/19/19 0452 12/20/19 0734 12/21/19 0450 12/22/19 0553 12/24/19 0704  WBC 10.6* 19.6* 16.6* 16.3* 16.2*  NEUTROABS 9.4* 17.2* 14.8* 14.2* 13.4*  HGB 14.3 16.1 14.6 14.5 14.5  HCT 43.8 50.2 46.0 45.1 44.8  MCV 89.6 90.5 91.3 89.8 90.0  PLT 144* 234 182 190 185   Cardiac Enzymes: No results for input(s): CKTOTAL, CKMB, CKMBINDEX, TROPONINI in the last 168 hours. BNP: Invalid input(s): POCBNP CBG: Recent Labs  Lab 12/23/19 1602 12/23/19 2004 12/24/19 0817 12/24/19 1133 12/24/19 1557  GLUCAP 302* 301* 324* 253* 162*   HbA1C: No results for input(s): HGBA1C in the last 72 hours. Urine analysis:    Component Value Date/Time   COLORURINE YELLOW 12/14/2019 0451   APPEARANCEUR  HAZY (A) 12/14/2019 0451   LABSPEC 1.023 12/14/2019 0451   PHURINE 5.0 12/14/2019 0451   GLUCOSEU 50 (A) 12/14/2019 0451   HGBUR NEGATIVE 12/14/2019 0451   BILIRUBINUR NEGATIVE 12/14/2019 0451   KETONESUR 20 (A) 12/14/2019 0451   PROTEINUR 30 (A) 12/14/2019 0451   NITRITE NEGATIVE 12/14/2019 0451   LEUKOCYTESUR NEGATIVE 12/14/2019 0451   Sepsis Labs: @LABRCNTIP (procalcitonin:4,lacticidven:4) ) Recent Results (from the past 240 hour(s))  Blood Culture (routine x 2)     Status: None   Collection Time: 12/17/19  5:50 PM   Specimen: Left Antecubital; Blood  Result Value Ref Range Status   Specimen Description LEFT ANTECUBITAL  Final   Special Requests   Final    BOTTLES DRAWN AEROBIC AND ANAEROBIC Blood Culture adequate volume   Culture   Final    NO GROWTH 5 DAYS Performed at [facility], [address], [place], [place] [address]    Report Status 12/22/2019 FINAL  Final  Blood Culture (routine x 2)     Status: None   Collection Time: 12/17/19  5:58 PM   Specimen: BLOOD LEFT ARM  Result Value Ref Range Status   Specimen Description BLOOD  LEFT ARM  Final   Special Requests   Final    BOTTLES DRAWN AEROBIC AND ANAEROBIC Blood Culture adequate volume   Culture   Final    NO GROWTH 5 DAYS Performed at [facility], [address], [place], [place] [address]    Report Status 12/22/2019 FINAL  Final  MRSA PCR Screening     Status: None   Collection Time: 12/20/19  2:48 PM   Specimen: Nasal Mucosa; Nasopharyngeal  Result Value Ref Range Status   MRSA by PCR NEGATIVE NEGATIVE Final    Comment:        The GeneXpert MRSA Assay (FDA approved for NASAL specimens only), is one component of a comprehensive MRSA colonization surveillance program. It is not intended to diagnose MRSA infection nor to guide or monitor treatment for MRSA infections. Performed at [facility], [address], [place],  [address]      Scheduled Meds: . albuterol  2 puff Inhalation TID  . vitamin C  500 mg Oral Daily  . aspirin EC  81 mg Oral Daily  . baricitinib  4 mg Oral Daily  . Chlorhexidine Gluconate Cloth  6 each Topical Daily  . enoxaparin (LOVENOX) injection  0.5 mg/kg Subcutaneous Q24H  . guaiFENesin  600 mg Oral BID  . insulin aspart  0-20 Units Subcutaneous TID WC  . insulin aspart  0-5 Units Subcutaneous QHS  . insulin aspart  25 Units Subcutaneous TID WC  . insulin glargine  20 Units Subcutaneous Daily  . insulin glargine  80 Units Subcutaneous QHS  . linagliptin  5 mg Oral Daily  . methylPREDNISolone (SOLU-MEDROL) injection  125 mg Intravenous Q12H  . pantoprazole  40 mg Oral Daily  . sodium zirconium cyclosilicate  10 g Oral Daily  . vitamin B-12  500 mcg Oral Daily  . zinc sulfate  220 mg Oral Daily   Continuous Infusions: . diltiazem (CARDIZEM) infusion Stopped (12/21/19 0947)    Procedures/Studies: CT ANGIO CHEST PE W OR WO CONTRAST  Result Date: 12/21/2019 CLINICAL DATA:  Hypoxia and COVID pneumonia.  Weakness. EXAM: CT ANGIOGRAPHY CHEST WITH CONTRAST TECHNIQUE: Multidetector CT imaging of the chest was  performed using the standard protocol during bolus administration of intravenous contrast. Multiplanar CT image reconstructions and MIPs were obtained to evaluate the vascular  anatomy. CONTRAST:  143mL OMNIPAQUE IOHEXOL 350 MG/ML SOLN COMPARISON:  Radiograph yesterday, additional priors.  No prior CT. FINDINGS: Cardiovascular: There are no filling defects within the pulmonary arteries to suggest pulmonary embolus. Mild multi chamber cardiomegaly. There are coronary artery calcifications. Atherosclerosis of the thoracic aorta without dissection or acute aortic finding. Conventional branching pattern from the aortic arch. Mediastinum/Nodes: Borderline and mildly enlarged mediastinal and hilar lymph nodes. For example 15 mm lower paratracheal node, series 5, image 97. 15 mm right hilar node series 5, image 126. There is no esophageal wall thickening. Tiny hiatal hernia. The right lobe of the thyroid gland is enlarged and extends posteriorly and but no discrete nodules are seen. No pneumomediastinum. Lungs/Pleura: Relatively diffuse ground-glass opacity throughout both lungs, geographic in the lower lobes with areas of consolidation. Underlying emphysema. There is no pneumothorax. No significant pleural effusion. Upper Abdomen: Enlarged liver with steatosis. Splenomegaly. Fatty atrophy of the pancreas. No acute upper abdominal findings. Musculoskeletal: Multilevel degenerative change in the spine. Prominent Schmorl's nodes in superior endplate of T5. Review of the MIP images confirms the above findings. IMPRESSION: 1. No pulmonary embolus. 2. Relatively diffuse ground-glass opacity throughout both lungs, geographic in the lower lobes with areas of consolidation. Findings most consistent with COVID-19 pneumonia. Parenchymal involvement is extensive. 3. Borderline and mildly enlarged mediastinal and hilar lymph nodes are likely reactive. 4. Mild emphysema. 5. Aortic atherosclerosis.  Coronary artery calcifications. 6.  Hepatosplenomegaly. Hepatic steatosis. Aortic Atherosclerosis (ICD10-I70.0) and Emphysema (ICD10-J43.9). Electronically Signed   By: Keith Rake M.D.   On: 12/21/2019 18:15   DG CHEST PORT 1 VIEW  Result Date: 12/20/2019 CLINICAL DATA:  COVID-19 pneumonia EXAM: PORTABLE CHEST 1 VIEW COMPARISON:  12/17/2019 FINDINGS: The lung volumes are low. The heart size is enlarged. Again noted are bilateral hazy airspace opacities which have worsened in the right upper and right lower lung zones. There is no pneumothorax. No large pleural effusion. IMPRESSION: Persistent multifocal airspace opacities which have worsened in the right upper and right lower lung zones. Electronically Signed   By: Constance Holster M.D.   On: 12/20/2019 18:10   DG Chest Port 1 View  Result Date: 12/17/2019 CLINICAL DATA:  COVID pneumonia, dyspnea EXAM: PORTABLE CHEST 1 VIEW COMPARISON:  12/14/2019 FINDINGS: In lung volumes are small, but are symmetric and are stable since prior examination. There has, however, developed progressive pulmonary infiltrates within the mid and lower lung zones bilaterally, particularly within the right mid lung zone, in keeping with changes of progressive bronchopneumonia. No pneumothorax or pleural effusion. Cardiac size within normal limits. No acute bone abnormality. IMPRESSION: Progressive pulmonary infiltrates in keeping with multifocal bronchopneumonia. Electronically Signed   By: Fidela Salisbury MD   On: 12/17/2019 17:38   DG Chest Portable 1 View  Result Date: 12/14/2019 CLINICAL DATA:  Patient collapsed.  Wife is COVID-19 positive. EXAM: PORTABLE CHEST 1 VIEW COMPARISON:  08/24/2017. FINDINGS: Mediastinum and hilar structures normal. Heart size normal. Low lung volumes. Mild bibasilar and or scarring again noted. Mild left base infiltrate cannot be excluded. Small left pleural effusion cannot be excluded. No pneumothorax. Degenerative change thoracic spine. IMPRESSION: Mild bibasilar atelectasis  and or scarring again noted. Mild left base infiltrate cannot be excluded. Small left pleural effusion cannot be excluded. Electronically Signed   By: Marcello Moores  Register   On: 12/14/2019 05:26   ECHOCARDIOGRAM COMPLETE  Result Date: 12/20/2019    ECHOCARDIOGRAM REPORT   Patient Name:   ASHVIK GRUNDMAN Date of Exam: 12/20/2019 Medical Rec #:  [id number]       Height:       68.0 in Accession #:    [id number]      Weight:       334.2 lb Date of Birth:  [birth year]       BSA:          2.542 m Patient Age:    18 years        BP:           137/62 mmHg Patient Gender: M               HR:           92 bpm. Exam Location:  [facility] Procedure: 2D Echo, Cardiac Doppler and Color Doppler Indications:    SVT (supraventricular tachycardia)  History:        Patient has prior history of Echocardiogram examinations, most                 recent 09/01/2017. Risk Factors:Diabetes and Dyslipidemia. Morbid                 obesity, Pneumonia due to COVID-19 virus, SVT (supraventricular                 tachycardia).  Sonographer:    Alvino Chapel RCS Referring Phys: [phone] Neria Procter IMPRESSIONS  1. Left ventricular ejection fraction, by estimation, is 60 to 65%. The left ventricle has normal function. The left ventricle has no regional wall motion abnormalities. There is mild left ventricular hypertrophy. Left ventricular diastolic parameters are consistent with Grade I diastolic dysfunction (impaired relaxation).  2. Right ventricular systolic function is normal. The right ventricular size is normal.  3. The mitral valve is normal in structure. No evidence of mitral valve regurgitation. No evidence of mitral stenosis. Moderate mitral annular calcification.  4. The aortic valve has an indeterminant number of cusps. There is moderate calcification of the aortic valve. There is moderate thickening of the aortic valve. Aortic valve regurgitation is not visualized. No aortic stenosis is present. FINDINGS  Left Ventricle: Left ventricular ejection  fraction, by estimation, is 60 to 65%. The left ventricle has normal function. The left ventricle has no regional wall motion abnormalities. Definity contrast agent was given IV to delineate the left ventricular  endocardial borders. The left ventricular internal cavity size was normal in size. There is mild left ventricular hypertrophy. Left ventricular diastolic parameters are consistent with Grade I diastolic dysfunction (impaired relaxation). Normal left ventricular filling pressure. Right Ventricle: The right ventricular size is normal. No increase in right ventricular wall thickness. Right ventricular systolic function is normal. Left Atrium: Left atrial size was normal in size. Right Atrium: Right atrial size was normal in size. Pericardium: There is no evidence of pericardial effusion. Mitral Valve: The mitral valve is normal in structure. There is mild thickening of the mitral valve leaflet(s). There is mild calcification of the mitral valve leaflet(s). Moderate mitral annular calcification. No evidence of mitral valve regurgitation. No evidence of mitral valve stenosis. Tricuspid Valve: The tricuspid valve is normal in structure. Tricuspid valve regurgitation is not demonstrated. No evidence of tricuspid stenosis. Aortic Valve: The aortic valve has an indeterminant number of cusps. There is moderate calcification of the aortic valve. There is moderate thickening of the aortic valve. There is moderate aortic valve annular calcification. Aortic valve regurgitation is not visualized. No aortic stenosis is present. Pulmonic Valve: The pulmonic valve was not well visualized. Pulmonic valve regurgitation is not  visualized. No evidence of pulmonic stenosis. Aorta: The aortic root is normal in size and structure. Pulmonary Artery: Indeterminate PASP, inadequate TR jet. IAS/Shunts: No atrial level shunt detected by color flow Doppler.  LEFT VENTRICLE PLAX 2D LVIDd:         4.74 cm  Diastology LVIDs:         2.87 cm   LV e' medial:    7.94 cm/s LV PW:         1.13 cm  LV E/e' medial:  11.4 LV IVS:        1.33 cm  LV e' lateral:   10.10 cm/s LVOT diam:     1.70 cm  LV E/e' lateral: 8.9 LV SV:         44 LV SV Index:   17 LVOT Area:     2.27 cm  RIGHT VENTRICLE RV S prime:     12.90 cm/s TAPSE (M-mode): 2.1 cm LEFT ATRIUM           Index LA diam:      4.00 cm 1.57 cm/m LA Vol (A2C): 34.5 ml 13.57 ml/m LA Vol (A4C): 47.9 ml 18.84 ml/m  AORTIC VALVE LVOT Vmax:   101.00 cm/s LVOT Vmean:  71.300 cm/s LVOT VTI:    0.192 m  AORTA Ao Root diam: 3.50 cm MITRAL VALVE MV Area (PHT): 4.77 cm     SHUNTS MV Decel Time: 159 msec     Systemic VTI:  0.19 m MV E velocity: 90.30 cm/s   Systemic Diam: 1.70 cm MV A velocity: 128.00 cm/s MV E/A ratio:  0.71 Carlyle Dolly MD Electronically signed by Carlyle Dolly MD Signature Date/Time: 12/20/2019/5:59:05 PM    Final     Orson Eva, DO  [facility]  If 7PM-7AM, please contact night-coverage [email or web address] Password [facility] 12/24/2019, 4:39 PM   LOS: 7 days

## 2019-12-25 LAB — FERRITIN: Ferritin: 100 ng/mL (ref 24–336)

## 2019-12-25 LAB — COMPREHENSIVE METABOLIC PANEL
ALT: 72 U/L — ABNORMAL HIGH (ref 0–44)
AST: 54 U/L — ABNORMAL HIGH (ref 15–41)
Albumin: 2.4 g/dL — ABNORMAL LOW (ref 3.5–5.0)
Alkaline Phosphatase: 66 U/L (ref 38–126)
Anion gap: 9 (ref 5–15)
BUN: 30 mg/dL — ABNORMAL HIGH (ref 8–23)
CO2: 29 mmol/L (ref 22–32)
Calcium: 8.6 mg/dL — ABNORMAL LOW (ref 8.9–10.3)
Chloride: 93 mmol/L — ABNORMAL LOW (ref 98–111)
Creatinine, Ser: 1.15 mg/dL (ref 0.61–1.24)
GFR calc Af Amer: >60 mL/min (ref >60)
GFR calc non Af Amer: >60 mL/min (ref >60)
Glucose, Bld: 247 mg/dL — ABNORMAL HIGH (ref 70–99)
Potassium: 5 mmol/L (ref 3.5–5.1)
Sodium: 131 mmol/L — ABNORMAL LOW (ref 135–145)
Total Bilirubin: 0.9 mg/dL (ref 0.3–1.2)
Total Protein: 5.5 g/dL — ABNORMAL LOW (ref 6.5–8.1)

## 2019-12-25 LAB — GLUCOSE, CAPILLARY
Glucose-Capillary: 226 mg/dL — ABNORMAL HIGH (ref 70–99)
Glucose-Capillary: 344 mg/dL — ABNORMAL HIGH (ref 70–99)

## 2019-12-25 LAB — C-REACTIVE PROTEIN: CRP: <0.5 mg/dL (ref <1.0)

## 2019-12-25 LAB — D-DIMER, QUANTITATIVE: D-Dimer, Quant: 1.21 ug/mL-FEU — ABNORMAL HIGH (ref 0.00–0.50)

## 2019-12-25 MED ORDER — INSULIN ASPART 100 UNIT/ML ~~LOC~~ SOLN
0.0000 [IU] | Freq: Three times a day (TID) | SUBCUTANEOUS | 0 refills | Status: AC
Start: 2019-12-25 — End: ?

## 2019-12-25 MED ORDER — LINAGLIPTIN 5 MG PO TABS: 5.0000 mg | ORAL_TABLET | Freq: Every day | ORAL | 1 refills | Status: AC

## 2019-12-25 MED ORDER — PREDNISONE 50 MG PO TABS: 100.0000 mg | ORAL_TABLET | Freq: Two times a day (BID) | ORAL | 0 refills | Status: AC

## 2019-12-25 MED ORDER — INSULIN STARTER KIT- SYRINGES (ENGLISH): 1.0000 | Freq: Once | Status: DC

## 2019-12-25 MED ORDER — "INSULIN SYRINGE-NEEDLE U-100 31G X 1/4"" 1 ML MISC": 0 refills | Status: AC

## 2019-12-25 NOTE — Progress Notes (Signed)
Patient discharged to home per order via wife in personal vehicle. AVS gone over with patient and spouse with no further questions. Education provided on glycemic management and at home oxygen use. 2 tanks sent with patient. All patient belongings sent with patient. VSS with no c/o pain or discomfort.

## 2019-12-25 NOTE — TOC Transition Note (Signed)
Transition of Care [facility]) - CM/SW Discharge Note   Patient Details  Name: [name] MRN: [id number] Date of Birth: [birth year]  Transition of Care [facility] CM/SW Contact:  Salome Arnt, LCSW Phone Number: 12/25/2019, 10:22 AM   Clinical Narrative:  Pt d/c today. LCSW spoke with Tim at [facility] who reports they will be unable to see pt until the end of the week. Referred to [facility] with Alvis Lemmings who can see pt by Wednesday. Pt updated and agreeable. Pt will require home O2. [employer] notified and will deliver oxygen. Pt aware. He states he has already contacted someone to pick him up today. Information for [employer] and Alvis Lemmings is on AVS and all orders are in. RN updated.     Final next level of care: Home w Home Health Services Barriers to Discharge: Barriers Resolved   Patient Goals and CMS Choice Patient states their goals for this hospitalization and ongoing recovery are:: return home      Discharge Placement                  Name of family member notified: pt only- pt has already contacted family Patient and family notified of of transfer: 12/25/19  Discharge Plan and Services In-house Referral: Clinical Social Work   Post Acute Care Choice: Home Health          DME Arranged: Oxygen DME Agency: [employer] Date DME Agency Contacted: 12/25/19 Time DME Agency Contacted: 76 Representative spoke with at DME Agency: Edwin Dada HH Arranged: RN, PT [facility] Agency: [place] Date [place]: 12/25/19 Time [place]: 1022 Representative spoke with at [place]: [place] ([place]) Interventions     Readmission Risk Interventions No flowsheet data found.

## 2019-12-25 NOTE — Discharge Summary (Signed)
Physician Discharge Summary  [name] UKG:[id number] DOB: [birth year] DOA: 12/17/2019  PCP: Sharilyn Sites, MD  Admit date: 12/17/2019 Discharge date: 12/25/2019  Admitted From: Home Disposition:  Home   Recommendations for Outpatient Follow-up:  1. Follow up with PCP in 1-2 weeks 2. Please obtain BMP/CBC in one week   Home Health: YES Equipment/Devices: 4L  Discharge Condition: Stable CODE STATUS: 4L Diet recommendation: Heart Healthy / Carb Modified    Brief/Interim Summary: 69 y.o.malewith medical history significant fortype 2 diabetes, prior tobacco abuse,Morbid obesity, HTN, HLD admitted on 12/17/19 with Covid PNA with Hypoxia---presented to the ED with complaints of some generalized weakness and hypoxemia. He was diagnosed with Covid 3 daysprior to admissionand has otherwise had mild symptoms. He states that he has beenfullyvaccinated with the Modernavaccine.Pt was previously Vaccinatedagainst Covid 19 (Moderna).He was noted to have a pulse ox reading in the 80th percentile at home which is what got his family concerned. They then urged him to come to the ED for further evaluation.His wife is also sick at home with Covid symptoms. On 12/20/19, the patient developed SVT with HR in 150-160 range and had an escalating amount of oxygen demand going from 4L up to 10L HFNC. He was transferred to stepdown and placed on diltiazem drip. He spontaneously converted back to sinus rhythm after 2 hours. He continued to required 8-10L HFNC. He was started on baricitinib on 12/21/19.  His improvement was slowed due to his frequent refusal to participate in his own care.  Discharge Diagnoses:  Acute hypoxic respiratory failure secondary to COVID-19 Pneumonia -treatment plan and use of medicationsfor treatment of COVID-19 infectionand possible side effects were discussed with patient/family -9/8 more hypoxic--increase to 12L HFNC>>5L -Patient is positive for COVID-19  infection, -9/8 personally reviewedchest x-ray--scattered patchy opacities -continueRemdesivir AND Steroid therapy per protocol  Ferritin 530>> 668>> 48>>47>>180>>411>>100 CRP 15.0 >> 14.6>>7.5>>3.5>>2.1>>0.8>>less than 0.5 D-Dimer 0.69 >> 0.72>>0.70>1.00>>1.22>>1.21>>1.21 -Fibrinogen 196 -12/21/19 PCT--0.10 -started baricitnib9/10 -12/20/19--lasix 40 mg IV x 1 -12/21/19--CTA chest--neg PE; diffuse GGO --Zinc and vitamin C as ordered -continuecombivent -Enhanced dosage of anticoagulant for DVT prophylaxis given hypercoagulable state with COVID-19 infections -Continue Remdesivir and Steroids started on 12/17/2019 -finished 5 days remdesivir on 12/21/19 -pt refusing PT--I have continued to encourage patient to [place] -d/c home with 2 more days prednisone -d/c home with oxygen 4L  SVT -9/8 personally reviewed EKG--SVT, nonspecific STT changes -TSH--0.374 -Echo-EF 60-65%, no WMA -personally reviewed EKG--SVT, nonspecific STT changes -start diltiazem drip>>spontaneous converted to sinus after 2 hours -remained in sinus rhythm thereafter  HyperKalemia- --repeat Lokelma  CKD-- IIIa -baseline creatinine 1.2-1.4 --- renally adjust medications, avoid nephrotoxic agents / dehydration / hypotension -serum creatinine 1.15 on day of d/c  DM2uncontrolled with hyperglycemia-- -A1c 8.9, reflecting uncontrolled diabetes with hyperglycemia  --hold Actos, Metformin , glipizide -IncreaseLantusto80 units; Add am lantus 20 units -increasenovolog25units with meals -d/c home with sliding scale insulin and linagliptin -follow up with PCP  Morbid Obesity -this complicates overall care and increases morbidity and mortality -lifestyle modification -Body mass index is 48.73 kg/m.   Discharge Instructions   Allergies as of 12/25/2019      Reactions   Fish Allergy Nausea And Vomiting   Motrin [ibuprofen] Other (See Comments)   Possible reaction resulting in "feeling like bugs  crawling all over me"   Sulfamethoxazole-trimethoprim Nausea And Vomiting      Medication List    STOP taking these medications   pioglitazone 30 MG tablet Commonly known as: ACTOS     TAKE these medications  glipiZIDE 10 MG 24 hr tablet Commonly known as: GLUCOTROL XL Take 10 mg by mouth 2 (two) times daily.   insulin aspart 100 UNIT/ML injection Commonly known as: novoLOG Inject 0-20 Units into the skin 3 (three) times daily with meals. Sugar 121-150--3 units; 151-200--4 units; 201-250--7 units; 251-300--11 units; 301-350--15 units; 351-400--20 units   Insulin Syringe-Needle U-100 31G X 1/4" 1 ML Misc Use with insulin to dispense insulin as directed   linagliptin 5 MG Tabs tablet Commonly known as: TRADJENTA Take 1 tablet (5 mg total) by mouth daily. Start taking on: December 26, 2019   metFORMIN 1000 MG tablet Commonly known as: GLUCOPHAGE Take 1,000 mg by mouth 2 (two) times daily with a meal.   predniSONE 50 MG tablet Commonly known as: DELTASONE Take 2 tablets (100 mg total) by mouth 2 (two) times daily with a meal.   Trulicity 4.5 TI/1.4ER Sopn Generic drug: Dulaglutide Inject 0.5 mLs into the skin every 7 (seven) days. Takes on Wednesday            Durable Medical Equipment  (From admission, onward)         Start     Ordered   12/25/19 0930  For home use only DME oxygen  Once       Question Answer Comment  Length of Need 6 Months   Mode or (Route) Nasal cannula   Liters per Minute 4   Frequency Continuous (stationary and portable oxygen unit needed)   Oxygen conserving device Yes   Oxygen delivery system Gas      12/25/19 0929          Follow-up Information    [facility] Follow up.   Why: Will provide oxygen. Contact information: [address] [place] [address] [phone]        [facility] Follow up.   Specialty: Home Health Services Why: Will call you to schedule physical therapy  and nursing visits.  Contact information: [place] [address] [phone]              Allergies  Allergen Reactions  . Fish Allergy Nausea And Vomiting  . Motrin [Ibuprofen] Other (See Comments)    Possible reaction resulting in "feeling like bugs crawling all over me"  . Sulfamethoxazole-Trimethoprim Nausea And Vomiting    Consultations:  none   Procedures/Studies: CT ANGIO CHEST PE W OR WO CONTRAST  Result Date: 12/21/2019 CLINICAL DATA:  Hypoxia and COVID pneumonia.  Weakness. EXAM: CT ANGIOGRAPHY CHEST WITH CONTRAST TECHNIQUE: Multidetector CT imaging of the chest was performed using the standard protocol during bolus administration of intravenous contrast. Multiplanar CT image reconstructions and MIPs were obtained to evaluate the vascular anatomy. CONTRAST:  141mL OMNIPAQUE IOHEXOL 350 MG/ML SOLN COMPARISON:  Radiograph yesterday, additional priors.  No prior CT. FINDINGS: Cardiovascular: There are no filling defects within the pulmonary arteries to suggest pulmonary embolus. Mild multi chamber cardiomegaly. There are coronary artery calcifications. Atherosclerosis of the thoracic aorta without dissection or acute aortic finding. Conventional branching pattern from the aortic arch. Mediastinum/Nodes: Borderline and mildly enlarged mediastinal and hilar lymph nodes. For example 15 mm lower paratracheal node, series 5, image 97. 15 mm right hilar node series 5, image 126. There is no esophageal wall thickening. Tiny hiatal hernia. The right lobe of the thyroid gland is enlarged and extends posteriorly and but no discrete nodules are seen. No pneumomediastinum. Lungs/Pleura: Relatively diffuse ground-glass opacity throughout both lungs, geographic  in the lower lobes with areas of consolidation. Underlying emphysema. There is no pneumothorax. No significant pleural effusion. Upper Abdomen: Enlarged liver with steatosis. Splenomegaly. Fatty atrophy of the  pancreas. No acute upper abdominal findings. Musculoskeletal: Multilevel degenerative change in the spine. Prominent Schmorl's nodes in superior endplate of T5. Review of the MIP images confirms the above findings. IMPRESSION: 1. No pulmonary embolus. 2. Relatively diffuse ground-glass opacity throughout both lungs, geographic in the lower lobes with areas of consolidation. Findings most consistent with COVID-19 pneumonia. Parenchymal involvement is extensive. 3. Borderline and mildly enlarged mediastinal and hilar lymph nodes are likely reactive. 4. Mild emphysema. 5. Aortic atherosclerosis.  Coronary artery calcifications. 6. Hepatosplenomegaly. Hepatic steatosis. Aortic Atherosclerosis (ICD10-I70.0) and Emphysema (ICD10-J43.9). Electronically Signed   By: Keith Rake M.D.   On: 12/21/2019 18:15   DG CHEST PORT 1 VIEW  Result Date: 12/20/2019 CLINICAL DATA:  COVID-19 pneumonia EXAM: PORTABLE CHEST 1 VIEW COMPARISON:  12/17/2019 FINDINGS: The lung volumes are low. The heart size is enlarged. Again noted are bilateral hazy airspace opacities which have worsened in the right upper and right lower lung zones. There is no pneumothorax. No large pleural effusion. IMPRESSION: Persistent multifocal airspace opacities which have worsened in the right upper and right lower lung zones. Electronically Signed   By: Constance Holster M.D.   On: 12/20/2019 18:10   DG Chest Port 1 View  Result Date: 12/17/2019 CLINICAL DATA:  COVID pneumonia, dyspnea EXAM: PORTABLE CHEST 1 VIEW COMPARISON:  12/14/2019 FINDINGS: In lung volumes are small, but are symmetric and are stable since prior examination. There has, however, developed progressive pulmonary infiltrates within the mid and lower lung zones bilaterally, particularly within the right mid lung zone, in keeping with changes of progressive bronchopneumonia. No pneumothorax or pleural effusion. Cardiac size within normal limits. No acute bone abnormality. IMPRESSION:  Progressive pulmonary infiltrates in keeping with multifocal bronchopneumonia. Electronically Signed   By: Fidela Salisbury MD   On: 12/17/2019 17:38   DG Chest Portable 1 View  Result Date: 12/14/2019 CLINICAL DATA:  Patient collapsed.  Wife is COVID-19 positive. EXAM: PORTABLE CHEST 1 VIEW COMPARISON:  08/24/2017. FINDINGS: Mediastinum and hilar structures normal. Heart size normal. Low lung volumes. Mild bibasilar and or scarring again noted. Mild left base infiltrate cannot be excluded. Small left pleural effusion cannot be excluded. No pneumothorax. Degenerative change thoracic spine. IMPRESSION: Mild bibasilar atelectasis and or scarring again noted. Mild left base infiltrate cannot be excluded. Small left pleural effusion cannot be excluded. Electronically Signed   By: Marcello Moores  Register   On: 12/14/2019 05:26   ECHOCARDIOGRAM COMPLETE  Result Date: 12/20/2019    ECHOCARDIOGRAM REPORT   Patient Name:   [name] Date of Exam: 12/20/2019 Medical Rec #:  [id number]       Height:       68.0 in Accession #:    [id number]      Weight:       334.2 lb Date of Birth:  09-Nov-1950       BSA:          2.542 m Patient Age:    18 years        BP:           137/62 mmHg Patient Gender: M               HR:           92 bpm. Exam Location:  [facility] Procedure: 2D Echo, Cardiac Doppler and Color  Doppler Indications:    SVT (supraventricular tachycardia)  History:        Patient has prior history of Echocardiogram examinations, most                 recent 09/01/2017. Risk Factors:Diabetes and Dyslipidemia. Morbid                 obesity, Pneumonia due to COVID-19 virus, SVT (supraventricular                 tachycardia).  Sonographer:    Alvino Chapel RCS Referring Phys: [phone] Margot Oriordan IMPRESSIONS  1. Left ventricular ejection fraction, by estimation, is 60 to 65%. The left ventricle has normal function. The left ventricle has no regional wall motion abnormalities. There is mild left ventricular hypertrophy. Left  ventricular diastolic parameters are consistent with Grade I diastolic dysfunction (impaired relaxation).  2. Right ventricular systolic function is normal. The right ventricular size is normal.  3. The mitral valve is normal in structure. No evidence of mitral valve regurgitation. No evidence of mitral stenosis. Moderate mitral annular calcification.  4. The aortic valve has an indeterminant number of cusps. There is moderate calcification of the aortic valve. There is moderate thickening of the aortic valve. Aortic valve regurgitation is not visualized. No aortic stenosis is present. FINDINGS  Left Ventricle: Left ventricular ejection fraction, by estimation, is 60 to 65%. The left ventricle has normal function. The left ventricle has no regional wall motion abnormalities. Definity contrast agent was given IV to delineate the left ventricular  endocardial borders. The left ventricular internal cavity size was normal in size. There is mild left ventricular hypertrophy. Left ventricular diastolic parameters are consistent with Grade I diastolic dysfunction (impaired relaxation). Normal left ventricular filling pressure. Right Ventricle: The right ventricular size is normal. No increase in right ventricular wall thickness. Right ventricular systolic function is normal. Left Atrium: Left atrial size was normal in size. Right Atrium: Right atrial size was normal in size. Pericardium: There is no evidence of pericardial effusion. Mitral Valve: The mitral valve is normal in structure. There is mild thickening of the mitral valve leaflet(s). There is mild calcification of the mitral valve leaflet(s). Moderate mitral annular calcification. No evidence of mitral valve regurgitation. No evidence of mitral valve stenosis. Tricuspid Valve: The tricuspid valve is normal in structure. Tricuspid valve regurgitation is not demonstrated. No evidence of tricuspid stenosis. Aortic Valve: The aortic valve has an indeterminant number  of cusps. There is moderate calcification of the aortic valve. There is moderate thickening of the aortic valve. There is moderate aortic valve annular calcification. Aortic valve regurgitation is not visualized. No aortic stenosis is present. Pulmonic Valve: The pulmonic valve was not well visualized. Pulmonic valve regurgitation is not visualized. No evidence of pulmonic stenosis. Aorta: The aortic root is normal in size and structure. Pulmonary Artery: Indeterminate PASP, inadequate TR jet. IAS/Shunts: No atrial level shunt detected by color flow Doppler.  LEFT VENTRICLE PLAX 2D LVIDd:         4.74 cm  Diastology LVIDs:         2.87 cm  LV e' medial:    7.94 cm/s LV PW:         1.13 cm  LV E/e' medial:  11.4 LV IVS:        1.33 cm  LV e' lateral:   10.10 cm/s LVOT diam:     1.70 cm  LV E/e' lateral: 8.9 LV SV:  44 LV SV Index:   17 LVOT Area:     2.27 cm  RIGHT VENTRICLE RV S prime:     12.90 cm/s TAPSE (M-mode): 2.1 cm LEFT ATRIUM           Index LA diam:      4.00 cm 1.57 cm/m LA Vol (A2C): 34.5 ml 13.57 ml/m LA Vol (A4C): 47.9 ml 18.84 ml/m  AORTIC VALVE LVOT Vmax:   101.00 cm/s LVOT Vmean:  71.300 cm/s LVOT VTI:    0.192 m  AORTA Ao Root diam: 3.50 cm MITRAL VALVE MV Area (PHT): 4.77 cm     SHUNTS MV Decel Time: 159 msec     Systemic VTI:  0.19 m MV E velocity: 90.30 cm/s   Systemic Diam: 1.70 cm MV A velocity: 128.00 cm/s MV E/A ratio:  0.71 Carlyle Dolly MD Electronically signed by Carlyle Dolly MD Signature Date/Time: 12/20/2019/5:59:05 PM    Final         Discharge Exam: Vitals:   12/25/19 0900 12/25/19 1000  BP:  134/63  Pulse: 61 87  Resp: 16 (!) 24  Temp:    SpO2: 91% 91%   Vitals:   12/25/19 0741 12/25/19 0800 12/25/19 0900 12/25/19 1000  BP:  (!) 104/42  134/63  Pulse: 69 (!) 59 61 87  Resp: (!) 21 19 16  (!) 24  Temp: 97.8 F (36.6 C)     TempSrc: Oral     SpO2: 94% 92% 91% 91%  Weight:      Height:        General: Pt is alert, awake, not in acute  distress Cardiovascular: RRR, S1/S2 +, no rubs, no gallops Respiratory: bibasilar rales Abdominal: Soft, NT, ND, bowel sounds + Extremities: no edema, no cyanosis   The results of significant diagnostics from this hospitalization (including imaging, microbiology, ancillary and laboratory) are listed below for reference.    Significant Diagnostic Studies: CT ANGIO CHEST PE W OR WO CONTRAST  Result Date: 12/21/2019 CLINICAL DATA:  Hypoxia and COVID pneumonia.  Weakness. EXAM: CT ANGIOGRAPHY CHEST WITH CONTRAST TECHNIQUE: Multidetector CT imaging of the chest was performed using the standard protocol during bolus administration of intravenous contrast. Multiplanar CT image reconstructions and MIPs were obtained to evaluate the vascular anatomy. CONTRAST:  117mL OMNIPAQUE IOHEXOL 350 MG/ML SOLN COMPARISON:  Radiograph yesterday, additional priors.  No prior CT. FINDINGS: Cardiovascular: There are no filling defects within the pulmonary arteries to suggest pulmonary embolus. Mild multi chamber cardiomegaly. There are coronary artery calcifications. Atherosclerosis of the thoracic aorta without dissection or acute aortic finding. Conventional branching pattern from the aortic arch. Mediastinum/Nodes: Borderline and mildly enlarged mediastinal and hilar lymph nodes. For example 15 mm lower paratracheal node, series 5, image 97. 15 mm right hilar node series 5, image 126. There is no esophageal wall thickening. Tiny hiatal hernia. The right lobe of the thyroid gland is enlarged and extends posteriorly and but no discrete nodules are seen. No pneumomediastinum. Lungs/Pleura: Relatively diffuse ground-glass opacity throughout both lungs, geographic in the lower lobes with areas of consolidation. Underlying emphysema. There is no pneumothorax. No significant pleural effusion. Upper Abdomen: Enlarged liver with steatosis. Splenomegaly. Fatty atrophy of the pancreas. No acute upper abdominal findings. Musculoskeletal:  Multilevel degenerative change in the spine. Prominent Schmorl's nodes in superior endplate of T5. Review of the MIP images confirms the above findings. IMPRESSION: 1. No pulmonary embolus. 2. Relatively diffuse ground-glass opacity throughout both lungs, geographic in the lower lobes with areas of consolidation.  Findings most consistent with COVID-19 pneumonia. Parenchymal involvement is extensive. 3. Borderline and mildly enlarged mediastinal and hilar lymph nodes are likely reactive. 4. Mild emphysema. 5. Aortic atherosclerosis.  Coronary artery calcifications. 6. Hepatosplenomegaly. Hepatic steatosis. Aortic Atherosclerosis (ICD10-I70.0) and Emphysema (ICD10-J43.9). Electronically Signed   By: Keith Rake M.D.   On: 12/21/2019 18:15   DG CHEST PORT 1 VIEW  Result Date: 12/20/2019 CLINICAL DATA:  COVID-19 pneumonia EXAM: PORTABLE CHEST 1 VIEW COMPARISON:  12/17/2019 FINDINGS: The lung volumes are low. The heart size is enlarged. Again noted are bilateral hazy airspace opacities which have worsened in the right upper and right lower lung zones. There is no pneumothorax. No large pleural effusion. IMPRESSION: Persistent multifocal airspace opacities which have worsened in the right upper and right lower lung zones. Electronically Signed   By: Constance Holster M.D.   On: 12/20/2019 18:10   DG Chest Port 1 View  Result Date: 12/17/2019 CLINICAL DATA:  COVID pneumonia, dyspnea EXAM: PORTABLE CHEST 1 VIEW COMPARISON:  12/14/2019 FINDINGS: In lung volumes are small, but are symmetric and are stable since prior examination. There has, however, developed progressive pulmonary infiltrates within the mid and lower lung zones bilaterally, particularly within the right mid lung zone, in keeping with changes of progressive bronchopneumonia. No pneumothorax or pleural effusion. Cardiac size within normal limits. No acute bone abnormality. IMPRESSION: Progressive pulmonary infiltrates in keeping with multifocal  bronchopneumonia. Electronically Signed   By: Fidela Salisbury MD   On: 12/17/2019 17:38   DG Chest Portable 1 View  Result Date: 12/14/2019 CLINICAL DATA:  Patient collapsed.  Wife is COVID-19 positive. EXAM: PORTABLE CHEST 1 VIEW COMPARISON:  08/24/2017. FINDINGS: Mediastinum and hilar structures normal. Heart size normal. Low lung volumes. Mild bibasilar and or scarring again noted. Mild left base infiltrate cannot be excluded. Small left pleural effusion cannot be excluded. No pneumothorax. Degenerative change thoracic spine. IMPRESSION: Mild bibasilar atelectasis and or scarring again noted. Mild left base infiltrate cannot be excluded. Small left pleural effusion cannot be excluded. Electronically Signed   By: Marcello Moores  Register   On: 12/14/2019 05:26   ECHOCARDIOGRAM COMPLETE  Result Date: 12/20/2019    ECHOCARDIOGRAM REPORT   Patient Name:   [name] Date of Exam: 12/20/2019 Medical Rec #:  [id number]       Height:       68.0 in Accession #:    [id number]      Weight:       334.2 lb Date of Birth:  Feb 16, 1951       BSA:          2.542 m Patient Age:    5 years        BP:           137/62 mmHg Patient Gender: M               HR:           92 bpm. Exam Location:  [facility] Procedure: 2D Echo, Cardiac Doppler and Color Doppler Indications:    SVT (supraventricular tachycardia)  History:        Patient has prior history of Echocardiogram examinations, most                 recent 09/01/2017. Risk Factors:Diabetes and Dyslipidemia. Morbid                 obesity, Pneumonia due to COVID-19 virus, SVT (supraventricular  tachycardia).  Sonographer:    Alvino Chapel RCS Referring Phys: [phone] Tawfiq Favila IMPRESSIONS  1. Left ventricular ejection fraction, by estimation, is 60 to 65%. The left ventricle has normal function. The left ventricle has no regional wall motion abnormalities. There is mild left ventricular hypertrophy. Left ventricular diastolic parameters are consistent with Grade I  diastolic dysfunction (impaired relaxation).  2. Right ventricular systolic function is normal. The right ventricular size is normal.  3. The mitral valve is normal in structure. No evidence of mitral valve regurgitation. No evidence of mitral stenosis. Moderate mitral annular calcification.  4. The aortic valve has an indeterminant number of cusps. There is moderate calcification of the aortic valve. There is moderate thickening of the aortic valve. Aortic valve regurgitation is not visualized. No aortic stenosis is present. FINDINGS  Left Ventricle: Left ventricular ejection fraction, by estimation, is 60 to 65%. The left ventricle has normal function. The left ventricle has no regional wall motion abnormalities. Definity contrast agent was given IV to delineate the left ventricular  endocardial borders. The left ventricular internal cavity size was normal in size. There is mild left ventricular hypertrophy. Left ventricular diastolic parameters are consistent with Grade I diastolic dysfunction (impaired relaxation). Normal left ventricular filling pressure. Right Ventricle: The right ventricular size is normal. No increase in right ventricular wall thickness. Right ventricular systolic function is normal. Left Atrium: Left atrial size was normal in size. Right Atrium: Right atrial size was normal in size. Pericardium: There is no evidence of pericardial effusion. Mitral Valve: The mitral valve is normal in structure. There is mild thickening of the mitral valve leaflet(s). There is mild calcification of the mitral valve leaflet(s). Moderate mitral annular calcification. No evidence of mitral valve regurgitation. No evidence of mitral valve stenosis. Tricuspid Valve: The tricuspid valve is normal in structure. Tricuspid valve regurgitation is not demonstrated. No evidence of tricuspid stenosis. Aortic Valve: The aortic valve has an indeterminant number of cusps. There is moderate calcification of the aortic valve.  There is moderate thickening of the aortic valve. There is moderate aortic valve annular calcification. Aortic valve regurgitation is not visualized. No aortic stenosis is present. Pulmonic Valve: The pulmonic valve was not well visualized. Pulmonic valve regurgitation is not visualized. No evidence of pulmonic stenosis. Aorta: The aortic root is normal in size and structure. Pulmonary Artery: Indeterminate PASP, inadequate TR jet. IAS/Shunts: No atrial level shunt detected by color flow Doppler.  LEFT VENTRICLE PLAX 2D LVIDd:         4.74 cm  Diastology LVIDs:         2.87 cm  LV e' medial:    7.94 cm/s LV PW:         1.13 cm  LV E/e' medial:  11.4 LV IVS:        1.33 cm  LV e' lateral:   10.10 cm/s LVOT diam:     1.70 cm  LV E/e' lateral: 8.9 LV SV:         44 LV SV Index:   17 LVOT Area:     2.27 cm  RIGHT VENTRICLE RV S prime:     12.90 cm/s TAPSE (M-mode): 2.1 cm LEFT ATRIUM           Index LA diam:      4.00 cm 1.57 cm/m LA Vol (A2C): 34.5 ml 13.57 ml/m LA Vol (A4C): 47.9 ml 18.84 ml/m  AORTIC VALVE LVOT Vmax:   101.00 cm/s LVOT Vmean:  71.300 cm/s LVOT  VTI:    0.192 m  AORTA Ao Root diam: 3.50 cm MITRAL VALVE MV Area (PHT): 4.77 cm     SHUNTS MV Decel Time: 159 msec     Systemic VTI:  0.19 m MV E velocity: 90.30 cm/s   Systemic Diam: 1.70 cm MV A velocity: 128.00 cm/s MV E/A ratio:  0.71 Carlyle Dolly MD Electronically signed by Carlyle Dolly MD Signature Date/Time: 12/20/2019/5:59:05 PM    Final      Microbiology: Recent Results (from the past 240 hour(s))  Blood Culture (routine x 2)     Status: None   Collection Time: 12/17/19  5:50 PM   Specimen: Left Antecubital; Blood  Result Value Ref Range Status   Specimen Description LEFT ANTECUBITAL  Final   Special Requests   Final    BOTTLES DRAWN AEROBIC AND ANAEROBIC Blood Culture adequate volume   Culture   Final    NO GROWTH 5 DAYS Performed at [facility], [address], [place], [place] [address]    Report Status 12/22/2019 FINAL   Final  Blood Culture (routine x 2)     Status: None   Collection Time: 12/17/19  5:58 PM   Specimen: BLOOD LEFT ARM  Result Value Ref Range Status   Specimen Description BLOOD LEFT ARM  Final   Special Requests   Final    BOTTLES DRAWN AEROBIC AND ANAEROBIC Blood Culture adequate volume   Culture   Final    NO GROWTH 5 DAYS Performed at [facility], [address] [place], [place] [address]    Report Status 12/22/2019 FINAL  Final  MRSA PCR Screening     Status: None   Collection Time: 12/20/19  2:48 PM   Specimen: Nasal Mucosa; Nasopharyngeal  Result Value Ref Range Status   MRSA by PCR NEGATIVE NEGATIVE Final    Comment:        The GeneXpert MRSA Assay (FDA approved for NASAL specimens only), is one component of a comprehensive MRSA colonization surveillance program. It is not intended to diagnose MRSA infection nor to guide or monitor treatment for MRSA infections. Performed at [facility], [address], [place], [place] [address]      Labs: Basic Metabolic Panel: Recent Labs  Lab 12/19/19 [phone] 12/19/19 0452 12/20/19 0734 12/20/19 1236 12/21/19 0450 12/21/19 0450 12/22/19 0553 12/22/19 0553 12/23/19 1059 12/23/19 1059 12/24/19 0704 12/25/19 0625  NA 138   < > 139  --  140  --  136  --  132*  --  133* 131*  K 5.0   < > 4.8  --  4.7   < > 4.8   < > 5.4*   < > 5.1 5.0  CL 100   < > 98  --  99  --  99  --  95*  --  94* 93*  CO2 24   < > 25  --  29  --  29  --  29  --  31 29  GLUCOSE 334*   < > 354*   < > 301*  --  293*  --  449*  --  284* 247*  BUN 34*   < > 32*  --  33*  --  32*  --  31*  --  29* 30*  CREATININE 1.25*   < > 1.25*  --  1.27*  --  1.13  --  1.26*  --  1.18 1.15  CALCIUM 8.0*   < > 8.5*  --  8.6*  --  8.2*  --  8.2*  --  8.6* 8.6*  MG 2.4  --  2.3  --  2.2  --  2.3  --   --   --   --   --    < > = values in this interval not displayed.   Liver Function Tests: Recent Labs  Lab 12/21/19 0450 12/22/19 0553 12/23/19 1059 12/24/19 0704  12/25/19 0625  AST 35 33 33 42* 54*  ALT 35 38 42 48* 72*  ALKPHOS 77 86 84 74 66  BILITOT 0.8 0.9 1.0 0.7 0.9  PROT 6.5 6.2* 6.1* 5.8* 5.5*  ALBUMIN 2.8* 2.7* 2.6* 2.5* 2.4*   No results for input(s): LIPASE, AMYLASE in the last 168 hours. No results for input(s): AMMONIA in the last 168 hours. CBC: Recent Labs  Lab 12/19/19 0452 12/20/19 0734 12/21/19 0450 12/22/19 0553 12/24/19 0704  WBC 10.6* 19.6* 16.6* 16.3* 16.2*  NEUTROABS 9.4* 17.2* 14.8* 14.2* 13.4*  HGB 14.3 16.1 14.6 14.5 14.5  HCT 43.8 50.2 46.0 45.1 44.8  MCV 89.6 90.5 91.3 89.8 90.0  PLT 144* 234 182 190 185   Cardiac Enzymes: No results for input(s): CKTOTAL, CKMB, CKMBINDEX, TROPONINI in the last 168 hours. BNP: Invalid input(s): POCBNP CBG: Recent Labs  Lab 12/24/19 0817 12/24/19 1133 12/24/19 1557 12/24/19 2158 12/25/19 0740  GLUCAP 324* 253* 162* 126* 226*    Time coordinating discharge:  36 minutes  Signed:  Orson Eva, DO [facility] Pager: [phone] 12/25/2019, 10:18 AM

## 2019-12-25 NOTE — Progress Notes (Signed)
SATURATION QUALIFICATIONS: (This note is used to comply with regulatory documentation for home oxygen)  Patient Saturations on Room Air at Rest = 86  Patient Saturations on Room Air while Ambulating = not done due to 86% at rest  Patient Saturations on 4 Liters of oxygen while Ambulating = 95%  Please briefly explain why patient needs home oxygen: To maintain 02 sat at 90% or above during ambulation.    DTat

## 2019-12-27 DIAGNOSIS — M170 Bilateral primary osteoarthritis of knee: Secondary | ICD-10-CM | POA: Diagnosis not present

## 2019-12-27 DIAGNOSIS — M160 Bilateral primary osteoarthritis of hip: Secondary | ICD-10-CM | POA: Diagnosis not present

## 2019-12-27 DIAGNOSIS — J9601 Acute respiratory failure with hypoxia: Secondary | ICD-10-CM | POA: Diagnosis not present

## 2019-12-27 DIAGNOSIS — E1122 Type 2 diabetes mellitus with diabetic chronic kidney disease: Secondary | ICD-10-CM | POA: Diagnosis not present

## 2019-12-27 DIAGNOSIS — U071 COVID-19: Secondary | ICD-10-CM | POA: Diagnosis not present

## 2019-12-27 DIAGNOSIS — I2510 Atherosclerotic heart disease of native coronary artery without angina pectoris: Secondary | ICD-10-CM | POA: Diagnosis not present

## 2019-12-27 DIAGNOSIS — J1282 Pneumonia due to coronavirus disease 2019: Secondary | ICD-10-CM | POA: Diagnosis not present

## 2019-12-27 DIAGNOSIS — J439 Emphysema, unspecified: Secondary | ICD-10-CM | POA: Diagnosis not present

## 2019-12-27 DIAGNOSIS — I1311 Hypertensive heart and chronic kidney disease without heart failure, with stage 5 chronic kidney disease, or end stage renal disease: Secondary | ICD-10-CM | POA: Diagnosis not present

## 2019-12-29 DIAGNOSIS — I2510 Atherosclerotic heart disease of native coronary artery without angina pectoris: Secondary | ICD-10-CM | POA: Diagnosis not present

## 2019-12-29 DIAGNOSIS — I1311 Hypertensive heart and chronic kidney disease without heart failure, with stage 5 chronic kidney disease, or end stage renal disease: Secondary | ICD-10-CM | POA: Diagnosis not present

## 2019-12-29 DIAGNOSIS — U071 COVID-19: Secondary | ICD-10-CM | POA: Diagnosis not present

## 2019-12-29 DIAGNOSIS — E1122 Type 2 diabetes mellitus with diabetic chronic kidney disease: Secondary | ICD-10-CM | POA: Diagnosis not present

## 2019-12-29 DIAGNOSIS — J1282 Pneumonia due to coronavirus disease 2019: Secondary | ICD-10-CM | POA: Diagnosis not present

## 2019-12-29 DIAGNOSIS — M170 Bilateral primary osteoarthritis of knee: Secondary | ICD-10-CM | POA: Diagnosis not present

## 2019-12-29 DIAGNOSIS — M160 Bilateral primary osteoarthritis of hip: Secondary | ICD-10-CM | POA: Diagnosis not present

## 2019-12-29 DIAGNOSIS — J439 Emphysema, unspecified: Secondary | ICD-10-CM | POA: Diagnosis not present

## 2019-12-29 DIAGNOSIS — J9601 Acute respiratory failure with hypoxia: Secondary | ICD-10-CM | POA: Diagnosis not present

## 2020-01-01 DIAGNOSIS — M160 Bilateral primary osteoarthritis of hip: Secondary | ICD-10-CM | POA: Diagnosis not present

## 2020-01-01 DIAGNOSIS — J9601 Acute respiratory failure with hypoxia: Secondary | ICD-10-CM | POA: Diagnosis not present

## 2020-01-01 DIAGNOSIS — J1282 Pneumonia due to coronavirus disease 2019: Secondary | ICD-10-CM | POA: Diagnosis not present

## 2020-01-01 DIAGNOSIS — I2510 Atherosclerotic heart disease of native coronary artery without angina pectoris: Secondary | ICD-10-CM | POA: Diagnosis not present

## 2020-01-01 DIAGNOSIS — Z681 Body mass index (BMI) 19 or less, adult: Secondary | ICD-10-CM | POA: Diagnosis not present

## 2020-01-01 DIAGNOSIS — U071 COVID-19: Secondary | ICD-10-CM | POA: Diagnosis not present

## 2020-01-01 DIAGNOSIS — J439 Emphysema, unspecified: Secondary | ICD-10-CM | POA: Diagnosis not present

## 2020-01-01 DIAGNOSIS — M170 Bilateral primary osteoarthritis of knee: Secondary | ICD-10-CM | POA: Diagnosis not present

## 2020-01-01 DIAGNOSIS — E1122 Type 2 diabetes mellitus with diabetic chronic kidney disease: Secondary | ICD-10-CM | POA: Diagnosis not present

## 2020-01-01 DIAGNOSIS — I1311 Hypertensive heart and chronic kidney disease without heart failure, with stage 5 chronic kidney disease, or end stage renal disease: Secondary | ICD-10-CM | POA: Diagnosis not present

## 2020-01-02 DIAGNOSIS — I2510 Atherosclerotic heart disease of native coronary artery without angina pectoris: Secondary | ICD-10-CM | POA: Diagnosis not present

## 2020-01-02 DIAGNOSIS — I1311 Hypertensive heart and chronic kidney disease without heart failure, with stage 5 chronic kidney disease, or end stage renal disease: Secondary | ICD-10-CM | POA: Diagnosis not present

## 2020-01-02 DIAGNOSIS — E1122 Type 2 diabetes mellitus with diabetic chronic kidney disease: Secondary | ICD-10-CM | POA: Diagnosis not present

## 2020-01-02 DIAGNOSIS — M170 Bilateral primary osteoarthritis of knee: Secondary | ICD-10-CM | POA: Diagnosis not present

## 2020-01-02 DIAGNOSIS — J439 Emphysema, unspecified: Secondary | ICD-10-CM | POA: Diagnosis not present

## 2020-01-02 DIAGNOSIS — M160 Bilateral primary osteoarthritis of hip: Secondary | ICD-10-CM | POA: Diagnosis not present

## 2020-01-02 DIAGNOSIS — U071 COVID-19: Secondary | ICD-10-CM | POA: Diagnosis not present

## 2020-01-02 DIAGNOSIS — J9601 Acute respiratory failure with hypoxia: Secondary | ICD-10-CM | POA: Diagnosis not present

## 2020-01-02 DIAGNOSIS — J1282 Pneumonia due to coronavirus disease 2019: Secondary | ICD-10-CM | POA: Diagnosis not present

## 2020-01-04 DIAGNOSIS — J9601 Acute respiratory failure with hypoxia: Secondary | ICD-10-CM | POA: Diagnosis not present

## 2020-01-04 DIAGNOSIS — I2510 Atherosclerotic heart disease of native coronary artery without angina pectoris: Secondary | ICD-10-CM | POA: Diagnosis not present

## 2020-01-04 DIAGNOSIS — E1122 Type 2 diabetes mellitus with diabetic chronic kidney disease: Secondary | ICD-10-CM | POA: Diagnosis not present

## 2020-01-04 DIAGNOSIS — J1282 Pneumonia due to coronavirus disease 2019: Secondary | ICD-10-CM | POA: Diagnosis not present

## 2020-01-04 DIAGNOSIS — U071 COVID-19: Secondary | ICD-10-CM | POA: Diagnosis not present

## 2020-01-04 DIAGNOSIS — J439 Emphysema, unspecified: Secondary | ICD-10-CM | POA: Diagnosis not present

## 2020-01-04 DIAGNOSIS — M170 Bilateral primary osteoarthritis of knee: Secondary | ICD-10-CM | POA: Diagnosis not present

## 2020-01-04 DIAGNOSIS — I1311 Hypertensive heart and chronic kidney disease without heart failure, with stage 5 chronic kidney disease, or end stage renal disease: Secondary | ICD-10-CM | POA: Diagnosis not present

## 2020-01-04 DIAGNOSIS — M160 Bilateral primary osteoarthritis of hip: Secondary | ICD-10-CM | POA: Diagnosis not present

## 2020-01-05 DIAGNOSIS — M170 Bilateral primary osteoarthritis of knee: Secondary | ICD-10-CM | POA: Diagnosis not present

## 2020-01-05 DIAGNOSIS — E1122 Type 2 diabetes mellitus with diabetic chronic kidney disease: Secondary | ICD-10-CM | POA: Diagnosis not present

## 2020-01-05 DIAGNOSIS — J1282 Pneumonia due to coronavirus disease 2019: Secondary | ICD-10-CM | POA: Diagnosis not present

## 2020-01-05 DIAGNOSIS — Z7689 Persons encountering health services in other specified circumstances: Secondary | ICD-10-CM | POA: Diagnosis not present

## 2020-01-05 DIAGNOSIS — M160 Bilateral primary osteoarthritis of hip: Secondary | ICD-10-CM | POA: Diagnosis not present

## 2020-01-05 DIAGNOSIS — U071 COVID-19: Secondary | ICD-10-CM | POA: Diagnosis not present

## 2020-01-05 DIAGNOSIS — I1311 Hypertensive heart and chronic kidney disease without heart failure, with stage 5 chronic kidney disease, or end stage renal disease: Secondary | ICD-10-CM | POA: Diagnosis not present

## 2020-01-05 DIAGNOSIS — I2510 Atherosclerotic heart disease of native coronary artery without angina pectoris: Secondary | ICD-10-CM | POA: Diagnosis not present

## 2020-01-05 DIAGNOSIS — E1165 Type 2 diabetes mellitus with hyperglycemia: Secondary | ICD-10-CM | POA: Diagnosis not present

## 2020-01-05 DIAGNOSIS — J9601 Acute respiratory failure with hypoxia: Secondary | ICD-10-CM | POA: Diagnosis not present

## 2020-01-05 DIAGNOSIS — J439 Emphysema, unspecified: Secondary | ICD-10-CM | POA: Diagnosis not present

## 2020-01-08 DIAGNOSIS — J439 Emphysema, unspecified: Secondary | ICD-10-CM | POA: Diagnosis not present

## 2020-01-08 DIAGNOSIS — M170 Bilateral primary osteoarthritis of knee: Secondary | ICD-10-CM | POA: Diagnosis not present

## 2020-01-08 DIAGNOSIS — I1311 Hypertensive heart and chronic kidney disease without heart failure, with stage 5 chronic kidney disease, or end stage renal disease: Secondary | ICD-10-CM | POA: Diagnosis not present

## 2020-01-08 DIAGNOSIS — U071 COVID-19: Secondary | ICD-10-CM | POA: Diagnosis not present

## 2020-01-08 DIAGNOSIS — M160 Bilateral primary osteoarthritis of hip: Secondary | ICD-10-CM | POA: Diagnosis not present

## 2020-01-08 DIAGNOSIS — E1122 Type 2 diabetes mellitus with diabetic chronic kidney disease: Secondary | ICD-10-CM | POA: Diagnosis not present

## 2020-01-08 DIAGNOSIS — J1282 Pneumonia due to coronavirus disease 2019: Secondary | ICD-10-CM | POA: Diagnosis not present

## 2020-01-08 DIAGNOSIS — I2510 Atherosclerotic heart disease of native coronary artery without angina pectoris: Secondary | ICD-10-CM | POA: Diagnosis not present

## 2020-01-08 DIAGNOSIS — J9601 Acute respiratory failure with hypoxia: Secondary | ICD-10-CM | POA: Diagnosis not present

## 2020-01-10 DIAGNOSIS — U071 COVID-19: Secondary | ICD-10-CM | POA: Diagnosis not present

## 2020-01-10 DIAGNOSIS — I2510 Atherosclerotic heart disease of native coronary artery without angina pectoris: Secondary | ICD-10-CM | POA: Diagnosis not present

## 2020-01-10 DIAGNOSIS — E1122 Type 2 diabetes mellitus with diabetic chronic kidney disease: Secondary | ICD-10-CM | POA: Diagnosis not present

## 2020-01-10 DIAGNOSIS — I1311 Hypertensive heart and chronic kidney disease without heart failure, with stage 5 chronic kidney disease, or end stage renal disease: Secondary | ICD-10-CM | POA: Diagnosis not present

## 2020-01-10 DIAGNOSIS — M160 Bilateral primary osteoarthritis of hip: Secondary | ICD-10-CM | POA: Diagnosis not present

## 2020-01-10 DIAGNOSIS — J439 Emphysema, unspecified: Secondary | ICD-10-CM | POA: Diagnosis not present

## 2020-01-10 DIAGNOSIS — J1282 Pneumonia due to coronavirus disease 2019: Secondary | ICD-10-CM | POA: Diagnosis not present

## 2020-01-10 DIAGNOSIS — M170 Bilateral primary osteoarthritis of knee: Secondary | ICD-10-CM | POA: Diagnosis not present

## 2020-01-10 DIAGNOSIS — J9601 Acute respiratory failure with hypoxia: Secondary | ICD-10-CM | POA: Diagnosis not present

## 2020-01-12 DIAGNOSIS — M170 Bilateral primary osteoarthritis of knee: Secondary | ICD-10-CM | POA: Diagnosis not present

## 2020-01-12 DIAGNOSIS — M160 Bilateral primary osteoarthritis of hip: Secondary | ICD-10-CM | POA: Diagnosis not present

## 2020-01-12 DIAGNOSIS — I2510 Atherosclerotic heart disease of native coronary artery without angina pectoris: Secondary | ICD-10-CM | POA: Diagnosis not present

## 2020-01-12 DIAGNOSIS — U071 COVID-19: Secondary | ICD-10-CM | POA: Diagnosis not present

## 2020-01-12 DIAGNOSIS — J439 Emphysema, unspecified: Secondary | ICD-10-CM | POA: Diagnosis not present

## 2020-01-12 DIAGNOSIS — I1311 Hypertensive heart and chronic kidney disease without heart failure, with stage 5 chronic kidney disease, or end stage renal disease: Secondary | ICD-10-CM | POA: Diagnosis not present

## 2020-01-12 DIAGNOSIS — E1122 Type 2 diabetes mellitus with diabetic chronic kidney disease: Secondary | ICD-10-CM | POA: Diagnosis not present

## 2020-01-12 DIAGNOSIS — J1282 Pneumonia due to coronavirus disease 2019: Secondary | ICD-10-CM | POA: Diagnosis not present

## 2020-01-12 DIAGNOSIS — J9601 Acute respiratory failure with hypoxia: Secondary | ICD-10-CM | POA: Diagnosis not present

## 2020-01-15 DIAGNOSIS — E1122 Type 2 diabetes mellitus with diabetic chronic kidney disease: Secondary | ICD-10-CM | POA: Diagnosis not present

## 2020-01-15 DIAGNOSIS — M170 Bilateral primary osteoarthritis of knee: Secondary | ICD-10-CM | POA: Diagnosis not present

## 2020-01-15 DIAGNOSIS — J1282 Pneumonia due to coronavirus disease 2019: Secondary | ICD-10-CM | POA: Diagnosis not present

## 2020-01-15 DIAGNOSIS — M160 Bilateral primary osteoarthritis of hip: Secondary | ICD-10-CM | POA: Diagnosis not present

## 2020-01-15 DIAGNOSIS — I1311 Hypertensive heart and chronic kidney disease without heart failure, with stage 5 chronic kidney disease, or end stage renal disease: Secondary | ICD-10-CM | POA: Diagnosis not present

## 2020-01-15 DIAGNOSIS — U071 COVID-19: Secondary | ICD-10-CM | POA: Diagnosis not present

## 2020-01-15 DIAGNOSIS — J9601 Acute respiratory failure with hypoxia: Secondary | ICD-10-CM | POA: Diagnosis not present

## 2020-01-15 DIAGNOSIS — I2510 Atherosclerotic heart disease of native coronary artery without angina pectoris: Secondary | ICD-10-CM | POA: Diagnosis not present

## 2020-01-15 DIAGNOSIS — J439 Emphysema, unspecified: Secondary | ICD-10-CM | POA: Diagnosis not present

## 2020-01-16 DIAGNOSIS — U071 COVID-19: Secondary | ICD-10-CM | POA: Diagnosis not present

## 2020-01-16 DIAGNOSIS — M160 Bilateral primary osteoarthritis of hip: Secondary | ICD-10-CM | POA: Diagnosis not present

## 2020-01-16 DIAGNOSIS — E1122 Type 2 diabetes mellitus with diabetic chronic kidney disease: Secondary | ICD-10-CM | POA: Diagnosis not present

## 2020-01-16 DIAGNOSIS — J9601 Acute respiratory failure with hypoxia: Secondary | ICD-10-CM | POA: Diagnosis not present

## 2020-01-16 DIAGNOSIS — J1282 Pneumonia due to coronavirus disease 2019: Secondary | ICD-10-CM | POA: Diagnosis not present

## 2020-01-16 DIAGNOSIS — I1311 Hypertensive heart and chronic kidney disease without heart failure, with stage 5 chronic kidney disease, or end stage renal disease: Secondary | ICD-10-CM | POA: Diagnosis not present

## 2020-01-16 DIAGNOSIS — I2510 Atherosclerotic heart disease of native coronary artery without angina pectoris: Secondary | ICD-10-CM | POA: Diagnosis not present

## 2020-01-16 DIAGNOSIS — J439 Emphysema, unspecified: Secondary | ICD-10-CM | POA: Diagnosis not present

## 2020-01-16 DIAGNOSIS — M170 Bilateral primary osteoarthritis of knee: Secondary | ICD-10-CM | POA: Diagnosis not present

## 2020-01-17 DIAGNOSIS — I1311 Hypertensive heart and chronic kidney disease without heart failure, with stage 5 chronic kidney disease, or end stage renal disease: Secondary | ICD-10-CM | POA: Diagnosis not present

## 2020-01-17 DIAGNOSIS — J1282 Pneumonia due to coronavirus disease 2019: Secondary | ICD-10-CM | POA: Diagnosis not present

## 2020-01-17 DIAGNOSIS — I2510 Atherosclerotic heart disease of native coronary artery without angina pectoris: Secondary | ICD-10-CM | POA: Diagnosis not present

## 2020-01-17 DIAGNOSIS — J439 Emphysema, unspecified: Secondary | ICD-10-CM | POA: Diagnosis not present

## 2020-01-17 DIAGNOSIS — M170 Bilateral primary osteoarthritis of knee: Secondary | ICD-10-CM | POA: Diagnosis not present

## 2020-01-17 DIAGNOSIS — J9601 Acute respiratory failure with hypoxia: Secondary | ICD-10-CM | POA: Diagnosis not present

## 2020-01-17 DIAGNOSIS — U071 COVID-19: Secondary | ICD-10-CM | POA: Diagnosis not present

## 2020-01-17 DIAGNOSIS — E1122 Type 2 diabetes mellitus with diabetic chronic kidney disease: Secondary | ICD-10-CM | POA: Diagnosis not present

## 2020-01-17 DIAGNOSIS — M160 Bilateral primary osteoarthritis of hip: Secondary | ICD-10-CM | POA: Diagnosis not present

## 2020-01-22 DIAGNOSIS — J439 Emphysema, unspecified: Secondary | ICD-10-CM | POA: Diagnosis not present

## 2020-01-22 DIAGNOSIS — U071 COVID-19: Secondary | ICD-10-CM | POA: Diagnosis not present

## 2020-01-22 DIAGNOSIS — M160 Bilateral primary osteoarthritis of hip: Secondary | ICD-10-CM | POA: Diagnosis not present

## 2020-01-22 DIAGNOSIS — I2510 Atherosclerotic heart disease of native coronary artery without angina pectoris: Secondary | ICD-10-CM | POA: Diagnosis not present

## 2020-01-22 DIAGNOSIS — E1122 Type 2 diabetes mellitus with diabetic chronic kidney disease: Secondary | ICD-10-CM | POA: Diagnosis not present

## 2020-01-22 DIAGNOSIS — J9601 Acute respiratory failure with hypoxia: Secondary | ICD-10-CM | POA: Diagnosis not present

## 2020-01-22 DIAGNOSIS — J1282 Pneumonia due to coronavirus disease 2019: Secondary | ICD-10-CM | POA: Diagnosis not present

## 2020-01-22 DIAGNOSIS — I1311 Hypertensive heart and chronic kidney disease without heart failure, with stage 5 chronic kidney disease, or end stage renal disease: Secondary | ICD-10-CM | POA: Diagnosis not present

## 2020-01-22 DIAGNOSIS — M170 Bilateral primary osteoarthritis of knee: Secondary | ICD-10-CM | POA: Diagnosis not present

## 2020-01-23 DIAGNOSIS — J439 Emphysema, unspecified: Secondary | ICD-10-CM | POA: Diagnosis not present

## 2020-01-23 DIAGNOSIS — M170 Bilateral primary osteoarthritis of knee: Secondary | ICD-10-CM | POA: Diagnosis not present

## 2020-01-23 DIAGNOSIS — M160 Bilateral primary osteoarthritis of hip: Secondary | ICD-10-CM | POA: Diagnosis not present

## 2020-01-23 DIAGNOSIS — U071 COVID-19: Secondary | ICD-10-CM | POA: Diagnosis not present

## 2020-01-23 DIAGNOSIS — J9601 Acute respiratory failure with hypoxia: Secondary | ICD-10-CM | POA: Diagnosis not present

## 2020-01-23 DIAGNOSIS — I2510 Atherosclerotic heart disease of native coronary artery without angina pectoris: Secondary | ICD-10-CM | POA: Diagnosis not present

## 2020-01-23 DIAGNOSIS — I1311 Hypertensive heart and chronic kidney disease without heart failure, with stage 5 chronic kidney disease, or end stage renal disease: Secondary | ICD-10-CM | POA: Diagnosis not present

## 2020-01-23 DIAGNOSIS — E1122 Type 2 diabetes mellitus with diabetic chronic kidney disease: Secondary | ICD-10-CM | POA: Diagnosis not present

## 2020-01-23 DIAGNOSIS — J1282 Pneumonia due to coronavirus disease 2019: Secondary | ICD-10-CM | POA: Diagnosis not present

## 2020-01-24 DIAGNOSIS — U071 COVID-19: Secondary | ICD-10-CM | POA: Diagnosis not present

## 2020-01-25 DIAGNOSIS — M160 Bilateral primary osteoarthritis of hip: Secondary | ICD-10-CM | POA: Diagnosis not present

## 2020-01-25 DIAGNOSIS — E1122 Type 2 diabetes mellitus with diabetic chronic kidney disease: Secondary | ICD-10-CM | POA: Diagnosis not present

## 2020-01-25 DIAGNOSIS — J439 Emphysema, unspecified: Secondary | ICD-10-CM | POA: Diagnosis not present

## 2020-01-25 DIAGNOSIS — J9601 Acute respiratory failure with hypoxia: Secondary | ICD-10-CM | POA: Diagnosis not present

## 2020-01-25 DIAGNOSIS — I2510 Atherosclerotic heart disease of native coronary artery without angina pectoris: Secondary | ICD-10-CM | POA: Diagnosis not present

## 2020-01-25 DIAGNOSIS — U071 COVID-19: Secondary | ICD-10-CM | POA: Diagnosis not present

## 2020-01-25 DIAGNOSIS — I1311 Hypertensive heart and chronic kidney disease without heart failure, with stage 5 chronic kidney disease, or end stage renal disease: Secondary | ICD-10-CM | POA: Diagnosis not present

## 2020-01-25 DIAGNOSIS — J1282 Pneumonia due to coronavirus disease 2019: Secondary | ICD-10-CM | POA: Diagnosis not present

## 2020-01-25 DIAGNOSIS — M170 Bilateral primary osteoarthritis of knee: Secondary | ICD-10-CM | POA: Diagnosis not present

## 2020-01-26 DIAGNOSIS — J439 Emphysema, unspecified: Secondary | ICD-10-CM | POA: Diagnosis not present

## 2020-01-26 DIAGNOSIS — J1282 Pneumonia due to coronavirus disease 2019: Secondary | ICD-10-CM | POA: Diagnosis not present

## 2020-01-26 DIAGNOSIS — I2510 Atherosclerotic heart disease of native coronary artery without angina pectoris: Secondary | ICD-10-CM | POA: Diagnosis not present

## 2020-01-26 DIAGNOSIS — J9601 Acute respiratory failure with hypoxia: Secondary | ICD-10-CM | POA: Diagnosis not present

## 2020-01-26 DIAGNOSIS — M170 Bilateral primary osteoarthritis of knee: Secondary | ICD-10-CM | POA: Diagnosis not present

## 2020-01-26 DIAGNOSIS — I1311 Hypertensive heart and chronic kidney disease without heart failure, with stage 5 chronic kidney disease, or end stage renal disease: Secondary | ICD-10-CM | POA: Diagnosis not present

## 2020-01-26 DIAGNOSIS — E1122 Type 2 diabetes mellitus with diabetic chronic kidney disease: Secondary | ICD-10-CM | POA: Diagnosis not present

## 2020-01-26 DIAGNOSIS — M160 Bilateral primary osteoarthritis of hip: Secondary | ICD-10-CM | POA: Diagnosis not present

## 2020-01-26 DIAGNOSIS — U071 COVID-19: Secondary | ICD-10-CM | POA: Diagnosis not present

## 2020-01-29 DIAGNOSIS — I1311 Hypertensive heart and chronic kidney disease without heart failure, with stage 5 chronic kidney disease, or end stage renal disease: Secondary | ICD-10-CM | POA: Diagnosis not present

## 2020-01-29 DIAGNOSIS — M160 Bilateral primary osteoarthritis of hip: Secondary | ICD-10-CM | POA: Diagnosis not present

## 2020-01-29 DIAGNOSIS — M170 Bilateral primary osteoarthritis of knee: Secondary | ICD-10-CM | POA: Diagnosis not present

## 2020-01-29 DIAGNOSIS — E1122 Type 2 diabetes mellitus with diabetic chronic kidney disease: Secondary | ICD-10-CM | POA: Diagnosis not present

## 2020-01-29 DIAGNOSIS — J1282 Pneumonia due to coronavirus disease 2019: Secondary | ICD-10-CM | POA: Diagnosis not present

## 2020-01-29 DIAGNOSIS — J439 Emphysema, unspecified: Secondary | ICD-10-CM | POA: Diagnosis not present

## 2020-01-29 DIAGNOSIS — U071 COVID-19: Secondary | ICD-10-CM | POA: Diagnosis not present

## 2020-01-29 DIAGNOSIS — I2510 Atherosclerotic heart disease of native coronary artery without angina pectoris: Secondary | ICD-10-CM | POA: Diagnosis not present

## 2020-01-29 DIAGNOSIS — J9601 Acute respiratory failure with hypoxia: Secondary | ICD-10-CM | POA: Diagnosis not present

## 2020-02-05 DIAGNOSIS — J439 Emphysema, unspecified: Secondary | ICD-10-CM | POA: Diagnosis not present

## 2020-02-05 DIAGNOSIS — J1282 Pneumonia due to coronavirus disease 2019: Secondary | ICD-10-CM | POA: Diagnosis not present

## 2020-02-05 DIAGNOSIS — M160 Bilateral primary osteoarthritis of hip: Secondary | ICD-10-CM | POA: Diagnosis not present

## 2020-02-05 DIAGNOSIS — J9601 Acute respiratory failure with hypoxia: Secondary | ICD-10-CM | POA: Diagnosis not present

## 2020-02-05 DIAGNOSIS — I1311 Hypertensive heart and chronic kidney disease without heart failure, with stage 5 chronic kidney disease, or end stage renal disease: Secondary | ICD-10-CM | POA: Diagnosis not present

## 2020-02-05 DIAGNOSIS — I2510 Atherosclerotic heart disease of native coronary artery without angina pectoris: Secondary | ICD-10-CM | POA: Diagnosis not present

## 2020-02-05 DIAGNOSIS — U071 COVID-19: Secondary | ICD-10-CM | POA: Diagnosis not present

## 2020-02-05 DIAGNOSIS — M170 Bilateral primary osteoarthritis of knee: Secondary | ICD-10-CM | POA: Diagnosis not present

## 2020-02-05 DIAGNOSIS — E1122 Type 2 diabetes mellitus with diabetic chronic kidney disease: Secondary | ICD-10-CM | POA: Diagnosis not present

## 2020-02-24 DIAGNOSIS — U071 COVID-19: Secondary | ICD-10-CM | POA: Diagnosis not present

## 2020-03-11 DIAGNOSIS — Z6841 Body Mass Index (BMI) 40.0 and over, adult: Secondary | ICD-10-CM | POA: Diagnosis not present

## 2020-03-11 DIAGNOSIS — E1129 Type 2 diabetes mellitus with other diabetic kidney complication: Secondary | ICD-10-CM | POA: Diagnosis not present

## 2020-03-11 DIAGNOSIS — Z23 Encounter for immunization: Secondary | ICD-10-CM | POA: Diagnosis not present

## 2020-03-13 DIAGNOSIS — Z23 Encounter for immunization: Secondary | ICD-10-CM | POA: Diagnosis not present

## 2020-07-10 DIAGNOSIS — Z1331 Encounter for screening for depression: Secondary | ICD-10-CM | POA: Diagnosis not present

## 2020-07-10 DIAGNOSIS — Z1389 Encounter for screening for other disorder: Secondary | ICD-10-CM | POA: Diagnosis not present

## 2020-07-10 DIAGNOSIS — E1165 Type 2 diabetes mellitus with hyperglycemia: Secondary | ICD-10-CM | POA: Diagnosis not present

## 2020-07-10 DIAGNOSIS — M545 Low back pain, unspecified: Secondary | ICD-10-CM | POA: Diagnosis not present

## 2020-07-10 DIAGNOSIS — Z6841 Body Mass Index (BMI) 40.0 and over, adult: Secondary | ICD-10-CM | POA: Diagnosis not present

## 2020-07-10 DIAGNOSIS — E1129 Type 2 diabetes mellitus with other diabetic kidney complication: Secondary | ICD-10-CM | POA: Diagnosis not present

## 2020-07-10 DIAGNOSIS — R898 Other abnormal findings in specimens from other organs, systems and tissues: Secondary | ICD-10-CM | POA: Diagnosis not present

## 2020-09-10 DIAGNOSIS — L0390 Cellulitis, unspecified: Secondary | ICD-10-CM | POA: Diagnosis not present

## 2020-09-10 DIAGNOSIS — Z6841 Body Mass Index (BMI) 40.0 and over, adult: Secondary | ICD-10-CM | POA: Diagnosis not present

## 2020-09-10 DIAGNOSIS — L0291 Cutaneous abscess, unspecified: Secondary | ICD-10-CM | POA: Diagnosis not present

## 2020-09-11 DIAGNOSIS — L0291 Cutaneous abscess, unspecified: Secondary | ICD-10-CM | POA: Diagnosis not present

## 2020-09-23 DIAGNOSIS — Z6841 Body Mass Index (BMI) 40.0 and over, adult: Secondary | ICD-10-CM | POA: Diagnosis not present

## 2020-09-23 DIAGNOSIS — L0291 Cutaneous abscess, unspecified: Secondary | ICD-10-CM | POA: Diagnosis not present

## 2021-07-04 DIAGNOSIS — Z743 Need for continuous supervision: Secondary | ICD-10-CM | POA: Diagnosis not present

## 2021-07-04 DIAGNOSIS — R404 Transient alteration of awareness: Secondary | ICD-10-CM | POA: Diagnosis not present

## 2021-07-12 DIAGNOSIS — 419620001 Death: Secondary | SNOMED CT | POA: Diagnosis not present

## 2021-07-12 DEATH — deceased
